# Patient Record
Sex: Male | Born: 1955 | Race: White | Hispanic: No | Marital: Married | Smoking: Former smoker
Health system: Southern US, Community
[De-identification: ages and names within clinical notes are randomized; demographics above are authoritative.]

## PROBLEM LIST (undated history)

## (undated) DIAGNOSIS — E119 Type 2 diabetes mellitus without complications: Secondary | ICD-10-CM

## (undated) DIAGNOSIS — E669 Obesity, unspecified: Secondary | ICD-10-CM

## (undated) DIAGNOSIS — I10 Essential (primary) hypertension: Secondary | ICD-10-CM

## (undated) DIAGNOSIS — H409 Unspecified glaucoma: Secondary | ICD-10-CM

## (undated) DIAGNOSIS — G4730 Sleep apnea, unspecified: Secondary | ICD-10-CM

## (undated) DIAGNOSIS — E785 Hyperlipidemia, unspecified: Secondary | ICD-10-CM

## (undated) HISTORY — DX: Obesity, unspecified: E66.9

## (undated) HISTORY — PX: WISDOM TOOTH EXTRACTION: SHX21

## (undated) HISTORY — DX: Sleep apnea, unspecified: G47.30

## (undated) HISTORY — DX: Hyperlipidemia, unspecified: E78.5

## (undated) HISTORY — DX: Unspecified glaucoma: H40.9

## (undated) HISTORY — DX: Type 2 diabetes mellitus without complications: E11.9

## (undated) HISTORY — PX: TONSILLECTOMY AND ADENOIDECTOMY: SUR1326

## (undated) HISTORY — DX: Essential (primary) hypertension: I10

---

## 2010-03-03 ENCOUNTER — Emergency Department (HOSPITAL_COMMUNITY)
Admission: EM | Admit: 2010-03-03 | Discharge: 2010-03-03 | Payer: Self-pay | Source: Home / Self Care | Admitting: Emergency Medicine

## 2010-05-13 LAB — DIFFERENTIAL
Basophils Absolute: 0 10*3/uL (ref 0.0–0.1)
Basophils Relative: 0 % (ref 0–1)
Eosinophils Absolute: 0.1 10*3/uL (ref 0.0–0.7)
Eosinophils Relative: 1 % (ref 0–5)
Lymphocytes Relative: 14 % (ref 12–46)

## 2010-05-13 LAB — URINE MICROSCOPIC-ADD ON

## 2010-05-13 LAB — URINALYSIS, ROUTINE W REFLEX MICROSCOPIC
Glucose, UA: NEGATIVE mg/dL
Leukocytes, UA: NEGATIVE
Nitrite: NEGATIVE
Protein, ur: 100 mg/dL — AB

## 2010-05-13 LAB — CBC
MCH: 30.6 pg (ref 26.0–34.0)
MCHC: 33.3 g/dL (ref 30.0–36.0)
MCV: 91.9 fL (ref 78.0–100.0)
Platelets: 233 10*3/uL (ref 150–400)
RDW: 13.8 % (ref 11.5–15.5)
WBC: 8.8 10*3/uL (ref 4.0–10.5)

## 2010-05-13 LAB — POCT I-STAT, CHEM 8
Calcium, Ion: 1.08 mmol/L — ABNORMAL LOW (ref 1.12–1.32)
Glucose, Bld: 191 mg/dL — ABNORMAL HIGH (ref 70–99)
HCT: 45 % (ref 39.0–52.0)
TCO2: 24 mmol/L (ref 0–100)

## 2010-05-13 LAB — LIPASE, BLOOD: Lipase: 27 U/L (ref 11–59)

## 2012-10-20 ENCOUNTER — Ambulatory Visit (INDEPENDENT_AMBULATORY_CARE_PROVIDER_SITE_OTHER): Payer: Managed Care, Other (non HMO) | Admitting: Family Medicine

## 2012-10-20 VITALS — BP 136/76 | HR 83 | Temp 98.9°F | Resp 18 | Ht 72.0 in | Wt 314.0 lb

## 2012-10-20 DIAGNOSIS — Z13220 Encounter for screening for lipoid disorders: Secondary | ICD-10-CM

## 2012-10-20 DIAGNOSIS — L089 Local infection of the skin and subcutaneous tissue, unspecified: Secondary | ICD-10-CM

## 2012-10-20 DIAGNOSIS — Z131 Encounter for screening for diabetes mellitus: Secondary | ICD-10-CM

## 2012-10-20 LAB — POCT GLYCOSYLATED HEMOGLOBIN (HGB A1C): Hemoglobin A1C: 12.6

## 2012-10-20 MED ORDER — METFORMIN HCL 1000 MG PO TABS: 1000.0000 mg | ORAL_TABLET | Freq: Two times a day (BID) | ORAL | Status: DC

## 2012-10-20 MED ORDER — DOXYCYCLINE HYCLATE 100 MG PO TABS: 100.0000 mg | ORAL_TABLET | Freq: Two times a day (BID) | ORAL | Status: DC

## 2012-10-20 NOTE — Progress Notes (Signed)
 [facility]:  Office Visit  Chief Complaint:  Chief Complaint  Patient presents with  . Abrasion    right shin happened Sunday today it looks infected    HPI: Jay [name] is a 57 y.o. male who complains of here for  Right shin scrap x 3 days. He slid his leg againsta rataining wall in front yard. He did not fall on it outright. He tried neosporin and hydrogen peroxide but has pain in area of abrasion when it is touched. He denies DM. Denies fevers, chills. Has tried cleaning with hydrogen peroxide and water. + warmth, redness, pain  History reviewed. No pertinent past medical history. Past Surgical History  Procedure Laterality Date  . Tonsilectomy, adenoidectomy, bilateral myringotomy and tubes     History   Social History  . Marital Status: Married    Spouse Name: N/A    Number of Children: N/A  . Years of Education: N/A   Social History Main Topics  . Smoking status: Never Smoker   . Smokeless tobacco: Current User  . Alcohol Use: 2.5 oz/week    5 drink(s) per week  . Drug Use: No  . Sexual Activity: None   Other Topics Concern  . None   Social History Narrative  . None   Family History  Problem Relation Age of Onset  . Diabetes Father    No Known Allergies Prior to Admission medications   Not on File     ROS: The patient denies fevers, chills, night sweats, unintentional weight loss, chest pain, palpitations, wheezing, dyspnea on exertion, nausea, vomiting, abdominal pain, dysuria, hematuria, melena, numbness, weakness, or tingling.   All other systems have been reviewed and were otherwise negative with the exception of those mentioned in the HPI and as above.    PHYSICAL EXAM: Filed Vitals:   10/20/12 0831  BP: 136/76  Pulse: 83  Temp: 98.9 F (37.2 C)  Resp: 18   Filed Vitals:   10/20/12 0831  Height: 6' (1.829 m)  Weight: 314 lb (142.429 kg)   Body mass index is 42.58 kg/(m^2).  General: Alert, no acute  distress HEENT:  Normocephalic, atraumatic, oropharynx patent. EOMI, PERRLA Cardiovascular:  Regular rate and rhythm, no rubs murmurs or gallops.  No Carotid bruits, radial pulse intact. No pedal edema.  Respiratory: Clear to auscultation bilaterally.  No wheezes, rales, or rhonchi.  No cyanosis, no use of accessory musculature GI: No organomegaly, abdomen is soft and non-tender, positive bowel sounds.  No masses. Skin: + erythematous, tender abrasion x 2 areas on right shin. No drainage. There is some necrotic slough, which we  washed off with gauze and NS water. + DP  Neurologic: Facial musculature symmetric. Psychiatric: Patient is appropriate throughout our interaction. Lymphatic: No cervical lymphadenopathy Musculoskeletal: Gait intact.   LABS: Results for orders placed in visit on 10/20/12  POCT GLYCOSYLATED HEMOGLOBIN (HGB A1C)      Result Value Range   Hemoglobin A1C 12.6       EKG/XRAY:   Primary read interpreted by Dr. Conley Rolls at [facility].   ASSESSMENT/PLAN: Encounter Diagnoses  Name Primary?  . Skin infection Yes  . Screening for diabetes mellitus    Rx Doxycycline Gave him leftover silvidene to use on abrasion and area of infection so that area stays appropriately moist but not wet, once daily with non adhesive pad for protection.  He denies having DM but I am suspicious he may be diabetic due to body habitus. He will be screened  for DM F/u prn or if no improvement in 72 hours or if there is expansion of redness. Currently there is redness and warmth around the rim of the abrasion which may or may not be early signs of cellulitis , no streaks of erythema going up leg yet.  Gross sideeffects, risk and benefits, and alternatives of medications d/w patient. Patient is aware that all medications have potential sideeffects and we are unable to predict every sideeffect or drug-drug interaction that may occur.  Patient had labs drawn and then left: Spoke with pt after he left  regarding elevated HbA1c-- that he has DM He will start on metformin We discussed SEs and things he can do , recommend ADA website Follow-up in 2-3 months Will get CMP and also Lipid for fasting blood work in AM, he will come in for just labs.  He will schedule annual visit and also DM f/u in 2-3 months with Drs Clelia Croft, Audria Nine or Neva Seat. He can take fasting blood sugars 1-2 times a week if he wants but I advise that HbA1C is ok for now, he states his wife is a nurse so will do it for him Declined going to diabetes education for now We can address all the other DM issues on next visit: ie foot care, optho exam, etc. .    ,  PHUONG, DO 10/20/2012 10:45 AM

## 2012-10-21 ENCOUNTER — Other Ambulatory Visit (INDEPENDENT_AMBULATORY_CARE_PROVIDER_SITE_OTHER): Payer: Managed Care, Other (non HMO)

## 2012-10-21 ENCOUNTER — Telehealth: Payer: Self-pay

## 2012-10-21 DIAGNOSIS — Z131 Encounter for screening for diabetes mellitus: Secondary | ICD-10-CM

## 2012-10-21 DIAGNOSIS — Z13220 Encounter for screening for lipoid disorders: Secondary | ICD-10-CM

## 2012-10-21 LAB — COMPREHENSIVE METABOLIC PANEL
ALT: 54 U/L — ABNORMAL HIGH (ref 0–53)
AST: 31 U/L (ref 0–37)
BUN: 13 mg/dL (ref 6–23)
Creat: 0.82 mg/dL (ref 0.50–1.35)
Total Bilirubin: 0.5 mg/dL (ref 0.3–1.2)

## 2012-10-21 LAB — COMPREHENSIVE METABOLIC PANEL WITH GFR
Albumin: 3.6 g/dL (ref 3.5–5.2)
Alkaline Phosphatase: 96 U/L (ref 39–117)
CO2: 24 meq/L (ref 19–32)
Calcium: 8.7 mg/dL (ref 8.4–10.5)
Chloride: 101 meq/L (ref 96–112)
Glucose, Bld: 286 mg/dL — ABNORMAL HIGH (ref 70–99)
Potassium: 4.6 meq/L (ref 3.5–5.3)
Sodium: 136 meq/L (ref 135–145)
Total Protein: 6 g/dL (ref 6.0–8.3)

## 2012-10-21 LAB — LIPID PANEL
Cholesterol: 259 mg/dL — ABNORMAL HIGH (ref 0–200)
HDL: 36 mg/dL — ABNORMAL LOW (ref >39)
LDL Cholesterol: 155 mg/dL — ABNORMAL HIGH (ref 0–99)
Total CHOL/HDL Ratio: 7.2 Ratio
Triglycerides: 338 mg/dL — ABNORMAL HIGH (ref <150)
VLDL: 68 mg/dL — ABNORMAL HIGH (ref 0–40)

## 2012-10-21 LAB — TSH: TSH: 2.609 u[IU]/mL (ref 0.350–4.500)

## 2012-10-21 MED ORDER — GLUCOSE BLOOD VI STRP: ORAL_STRIP | Status: DC

## 2012-10-21 MED ORDER — FREESTYLE LANCETS MISC: Status: AC

## 2012-10-21 MED ORDER — FREESTYLE SYSTEM KIT: PACK | Status: AC

## 2012-10-21 NOTE — Telephone Encounter (Signed)
Patient was Dx with Diabetics yesterday by Dr. Conley Rolls.  He would like to get the supplies to check he's blood sugars at home.  Please send to [facility]

## 2012-10-21 NOTE — Progress Notes (Signed)
Patient here for labs only. 

## 2012-11-18 ENCOUNTER — Telehealth: Payer: Self-pay | Admitting: Family Medicine

## 2012-11-18 DIAGNOSIS — E785 Hyperlipidemia, unspecified: Secondary | ICD-10-CM

## 2012-11-18 MED ORDER — ATORVASTATIN CALCIUM 40 MG PO TABS: 40.0000 mg | ORAL_TABLET | Freq: Every day | ORAL | Status: DC

## 2012-11-18 NOTE — Telephone Encounter (Signed)
Doing better, taking metformin regular, wife states his coloring is good nad he is better. Will start him on statin and also ask him to recheck Hba1c and fasting lipids and CMP in 2 months.

## 2013-01-06 ENCOUNTER — Other Ambulatory Visit: Payer: Self-pay

## 2013-02-28 ENCOUNTER — Encounter: Payer: Self-pay | Admitting: Family Medicine

## 2013-02-28 ENCOUNTER — Ambulatory Visit (INDEPENDENT_AMBULATORY_CARE_PROVIDER_SITE_OTHER): Payer: Managed Care, Other (non HMO) | Admitting: Family Medicine

## 2013-02-28 VITALS — BP 135/82 | HR 101 | Temp 97.7°F | Resp 18 | Ht 71.0 in | Wt 313.4 lb

## 2013-02-28 DIAGNOSIS — E669 Obesity, unspecified: Secondary | ICD-10-CM

## 2013-02-28 DIAGNOSIS — E785 Hyperlipidemia, unspecified: Secondary | ICD-10-CM

## 2013-02-28 DIAGNOSIS — E1165 Type 2 diabetes mellitus with hyperglycemia: Secondary | ICD-10-CM

## 2013-02-28 DIAGNOSIS — Z131 Encounter for screening for diabetes mellitus: Secondary | ICD-10-CM

## 2013-02-28 DIAGNOSIS — R05 Cough: Secondary | ICD-10-CM

## 2013-02-28 LAB — COMPREHENSIVE METABOLIC PANEL
AST: 24 U/L (ref 0–37)
Albumin: 3.9 g/dL (ref 3.5–5.2)
BUN: 15 mg/dL (ref 6–23)
Calcium: 9 mg/dL (ref 8.4–10.5)
Chloride: 98 mEq/L (ref 96–112)
Creat: 0.87 mg/dL (ref 0.50–1.35)
Glucose, Bld: 305 mg/dL — ABNORMAL HIGH (ref 70–99)
Potassium: 4.4 mEq/L (ref 3.5–5.3)

## 2013-02-28 LAB — POCT GLYCOSYLATED HEMOGLOBIN (HGB A1C): Hemoglobin A1C: 13

## 2013-02-28 LAB — LIPID PANEL
HDL: 30 mg/dL — ABNORMAL LOW (ref >39)
Total CHOL/HDL Ratio: 5.4 Ratio
Triglycerides: 230 mg/dL — ABNORMAL HIGH (ref <150)

## 2013-02-28 MED ORDER — ATORVASTATIN CALCIUM 40 MG PO TABS: 40.0000 mg | ORAL_TABLET | Freq: Every day | ORAL | Status: DC

## 2013-02-28 MED ORDER — METFORMIN HCL 1000 MG PO TABS: 1000.0000 mg | ORAL_TABLET | Freq: Two times a day (BID) | ORAL | Status: DC

## 2013-02-28 MED ORDER — GLIPIZIDE 5 MG PO TABS: 5.0000 mg | ORAL_TABLET | Freq: Two times a day (BID) | ORAL | Status: DC

## 2013-02-28 NOTE — Patient Instructions (Addendum)
To look up more info on your condition, go to the website [email or web address], then on patient resources - select [facility]. Under patient resources, select Diabetes.  Also ADA website can provide information.  You should receive a call or letter about your lab results within the next week to 10 days.   Start walking most days of week for exercise, diet changes with portion control and diabetes classes.  Continue metformin twice per day, add glipizide twice per day (watch for low blood sugar symptoms, but unlikely with your current readings- info on this below). Recheck in 6 weeks.   If cough not improving next week or two - recheck to discuss other possible causes.  Return to the clinic or go to the nearest emergency room if any of your symptoms worsen or new symptoms occur.   Diabetes and Exercise Exercising regularly is important. It is not just about losing weight. It has many health benefits, such as:  Improving your overall fitness, flexibility, and endurance.  Increasing your bone density.  Helping with weight control.  Decreasing your body fat.  Increasing your muscle strength.  Reducing stress and tension.  Improving your overall health. People with diabetes who exercise gain additional benefits because exercise:  Reduces appetite.  Improves the body's use of blood sugar (glucose).  Helps lower or control blood glucose.  Decreases blood pressure.  Helps control blood lipids (such as cholesterol and triglycerides).  Improves the body's use of the hormone insulin by:  Increasing the body's insulin sensitivity.  Reducing the body's insulin needs.  Decreases the risk for heart disease because exercising:  Lowers cholesterol and triglycerides levels.  Increases the levels of good cholesterol (such as high-density lipoproteins [HDL]) in the body.  Lowers blood glucose levels. YOUR ACTIVITY PLAN  Choose an activity that you enjoy and set realistic goals. Your health  care provider or diabetes educator can help you make an activity plan that works for you. You can break activities into 2 or 3 sessions throughout the day. Doing so is as good as one long session. Exercise ideas include:  Taking the dog for a walk.  Taking the stairs instead of the elevator.  Dancing to your favorite song.  Doing your favorite exercise with a friend. RECOMMENDATIONS FOR EXERCISING WITH TYPE 1 OR TYPE 2 DIABETES   Check your blood glucose before exercising. If blood glucose levels are greater than 240 mg/dL, check for urine ketones. Do not exercise if ketones are present.  Avoid injecting insulin into areas of the body that are going to be exercised. For example, avoid injecting insulin into:  The arms when playing tennis.  The legs when jogging.  Keep a record of:  Food intake before and after you exercise.  Expected peak times of insulin action.  Blood glucose levels before and after you exercise.  The type and amount of exercise you have done.  Review your records with your health care provider. Your health care provider will help you to develop guidelines for adjusting food intake and insulin amounts before and after exercising.  If you take insulin or oral hypoglycemic agents, watch for signs and symptoms of hypoglycemia. They include:  Dizziness.  Shaking.  Sweating.  Chills.  Confusion.  Drink plenty of water while you exercise to prevent dehydration or heat stroke. Body water is lost during exercise and must be replaced.  Talk to your health care provider before starting an exercise program to make sure it is safe for you. Remember, almost any  type of activity is better than none. Document Released: 05/10/2003 Document Revised: 10/20/2012 Document Reviewed: 07/27/2012 [facility] Patient Information 2014 [place], [place].  Hypoglycemia (Low Blood Sugar) Hypoglycemia is when the glucose (sugar) in your blood is too low. Hypoglycemia can happen for  many reasons. It can happen to people with or without diabetes. Hypoglycemia can develop quickly and can be a medical emergency.  CAUSES  Having hypoglycemia does not mean that you will develop diabetes. Different causes include:  Missed or delayed meals or not enough carbohydrates eaten.  Medication overdose. This could be by accident or deliberate. If by accident, your medication may need to be adjusted or changed.  Exercise or increased activity without adjustments in carbohydrates or medications.  A nerve disorder that affects body functions like your heart rate, blood pressure and digestion (autonomic neuropathy).  A condition where the stomach muscles do not function properly (gastroparesis). Therefore, medications may not absorb properly.  The inability to recognize the signs of hypoglycemia (hypoglycemic unawareness).  Absorption of insulin  may be altered.  Alcohol consumption.  Pregnancy/menstrual cycles/postpartum. This may be due to hormones.  Certain kinds of tumors. This is very rare. SYMPTOMS   Sweating.  Hunger.  Dizziness.  Blurred vision.  Drowsiness.  Weakness.  Headache.  Rapid heart beat.  Shakiness.  Nervousness. DIAGNOSIS  Diagnosis is made by monitoring blood glucose in one or all of the following ways:  Fingerstick blood glucose monitoring.  Laboratory results. TREATMENT  If you think your blood glucose is low:  Check your blood glucose, if possible. If it is less than 70 mg/dl, take one of the following:  3-4 glucose tablets.   cup juice (prefer clear like apple).   cup "regular" soda pop.  1 cup milk.  -1 tube of glucose gel.  5-6 hard candies.  Do not over treat because your blood glucose (sugar) will only go too high.  Wait 15 minutes and recheck your blood glucose. If it is still less than 70 mg/dl (or below your target range), repeat treatment.  Eat a snack if it is more than one hour until your next  meal. Sometimes, your blood glucose may go so low that you are unable to treat yourself. You may need someone to help you. You may even pass out or be unable to swallow. This may require you to get an injection of glucagon, which raises the blood glucose. HOME CARE INSTRUCTIONS  Check blood glucose as recommended by your caregiver.  Take medication as prescribed by your caregiver.  Follow your meal plan. Do not skip meals. Eat on time.  If you are going to drink alcohol, drink it only with meals.  Check your blood glucose before driving.  Check your blood glucose before and after exercise. If you exercise longer or different than usual, be sure to check blood glucose more frequently.  Always carry treatment with you. Glucose tablets are the easiest to carry.  Always wear medical alert jewelry or carry some form of identification that states that you have diabetes. This will alert people that you have diabetes. If you have hypoglycemia, they will have a better idea on what to do. SEEK MEDICAL CARE IF:   You are having problems keeping your blood sugar at target range.  You are having frequent episodes of hypoglycemia.  You feel you might be having side effects from your medicines.  You have symptoms of an illness that is not improving after 3-4 days.  You notice a change in  vision or a new problem with your vision. SEEK IMMEDIATE MEDICAL CARE IF:   You are a family member or friend of a person whose blood glucose goes below 70 mg/dl and is accompanied by:  Confusion.  A change in mental status.  The inability to swallow.  Passing out. Document Released: 02/17/2005 Document Revised: 05/12/2011 Document Reviewed: 06/16/2011 [facility] Patient Information 2014 [place], [place].

## 2013-02-28 NOTE — Progress Notes (Signed)
Subjective:    Patient ID: Jay [name], male    DOB: Nov 25, 1955, 57 y.o.   MRN: [id number]  HPI Jay [name] is a 57 y.o. male New pt to me, Patient was Dx wit Dm2 at 10/20/12 OV with Dr. Conley Rolls, had not been to doctor in past 5-6 years. HGB A1c 12.6. Started on metformin 1000mg  BID. Recommend ADA website, and follow-up in 2-3 months. Initially declined DM classes.   Dm2- home blood sugars checked with wife (who is a Engineer, civil (consulting)). 250-300's. Lowest 249, highest 320. Taking metformin BID - rare missed dose. May be amenable to night or weekend classes. optho appt this Friday. Has regular dental care. Has read up some on DM2. Weight 314 last ov - 313 today. No regular exercise, no diet changes.   Other concern from wife  - chest congestion at times this time of year. Still feels some upper congestion at times, but clearer over past few weeks. Rare cough now. No fever, no dyspnea, no chest pains. No known hx of allergies. Nonsmoker.   Hyperlipidemia - as below from 10/2012 labs. Started on Lipitor 40mg  qd. Fasting today. Running out recently - every other day for past weeks. No new side effects with statin.  Lab Results  Component Value Date   CHOL 259* 10/21/2012   HDL 36* 10/21/2012   LDLCALC 155* 10/21/2012   TRIG 338* 10/21/2012   CHOLHDL 7.2 10/21/2012   Review of Systems  Constitutional: Negative for fatigue and unexpected weight change.  Eyes: Negative for visual disturbance.  Cardiovascular: Negative for chest pain.  Genitourinary: Negative for difficulty urinating.  Musculoskeletal: Negative for arthralgias.  Neurological: Positive for numbness (toes tingling, but improved. ).       Objective:   Physical Exam Filed Vitals:   02/28/13 1336  BP: 135/82  Pulse: 101  Temp: 97.7 F (36.5 C)  TempSrc: Oral  Resp: 18  Height: 5\' 11"  (1.803 m)  Weight: 313 lb 6.4 oz (142.157 kg)  SpO2: 99%    Results for orders placed in visit on 02/28/13  POCT GLYCOSYLATED HEMOGLOBIN (HGB A1C)   Result Value Range   Hemoglobin A1C 13.0    GLUCOSE, POCT (MANUAL RESULT ENTRY)      Result Value Range   POC Glucose 304 (*) 70 - 99 mg/dl   EKG: sr, left axis, no acute findings, but no prior available for review.      Assessment & Plan:   Jay [name] is a 57 y.o. male DM (diabetes mellitus), type 2, uncontrolled - Plan: glipiZIDE (GLUCOTROL) 5 MG tablet, Ambulatory referral to diabetic education, EKG 12-Lead, EKG 12-Lead. Uncontrolled. Discussed likely need for basal insulin, but he would like to try to add oral medication and work on diet/exercise as recommended prior, with recheck in 6 weeks. Will also refer to DM /nut'nmgt center.   Other and unspecified hyperlipidemia - Plan: Comprehensive metabolic panel, Lipid Panel. Cont lipitor for now. May be elevated as not adherent to daily dosing recently with running out of meds. Encouraged in future to rtc or call prior to running out.   Obesity, unspecified - Plan: Ambulatory referral to diabetic education, EKG 12-Lead, EKG 12-Lead  Cough - clear, asx today. rtc if persists and consider CXR or other causes of recurrent cough (PND/AR/GERD).  Meds ordered this encounter  Medications  . glipiZIDE (GLUCOTROL) 5 MG tablet    Sig: Take 1 tablet (5 mg total) by mouth 2 (two) times daily before a meal.    Dispense:  60 tablet    Refill:  3  . metFORMIN (GLUCOPHAGE) 1000 MG tablet    Sig: Take 1 tablet (1,000 mg total) by mouth 2 (two) times daily with a meal.    Dispense:  180 tablet    Refill:  1  . atorvastatin (LIPITOR) 40 MG tablet    Sig: Take 1 tablet (40 mg total) by mouth at bedtime.    Dispense:  90 tablet    Refill:  1   Patient Instructions  To look up more info on your condition, go to the website [email or web address], then on patient resources - select [facility]. Under patient resources, select Diabetes.  Also ADA website can provide information.  You should receive a call or letter about your lab results within the next week to  10 days.   Start walking most days of week for exercise, diet changes with portion control and diabetes classes.  Continue metformin twice per day, add glipizide twice per day (watch for low blood sugar symptoms, but unlikely with your current readings- info on this below). Recheck in 6 weeks.   If cough not improving next week or two - recheck to discuss other possible causes.  Return to the clinic or go to the nearest emergency room if any of your symptoms worsen or new symptoms occur.   Diabetes and Exercise Exercising regularly is important. It is not just about losing weight. It has many health benefits, such as:  Improving your overall fitness, flexibility, and endurance.  Increasing your bone density.  Helping with weight control.  Decreasing your body fat.  Increasing your muscle strength.  Reducing stress and tension.  Improving your overall health. People with diabetes who exercise gain additional benefits because exercise:  Reduces appetite.  Improves the body's use of blood sugar (glucose).  Helps lower or control blood glucose.  Decreases blood pressure.  Helps control blood lipids (such as cholesterol and triglycerides).  Improves the body's use of the hormone insulin by:  Increasing the body's insulin sensitivity.  Reducing the body's insulin needs.  Decreases the risk for heart disease because exercising:  Lowers cholesterol and triglycerides levels.  Increases the levels of good cholesterol (such as high-density lipoproteins [HDL]) in the body.  Lowers blood glucose levels. YOUR ACTIVITY PLAN  Choose an activity that you enjoy and set realistic goals. Your health care provider or diabetes educator can help you make an activity plan that works for you. You can break activities into 2 or 3 sessions throughout the day. Doing so is as good as one long session. Exercise ideas include:  Taking the dog for a walk.  Taking the stairs instead of the  elevator.  Dancing to your favorite song.  Doing your favorite exercise with a friend. RECOMMENDATIONS FOR EXERCISING WITH TYPE 1 OR TYPE 2 DIABETES   Check your blood glucose before exercising. If blood glucose levels are greater than 240 mg/dL, check for urine ketones. Do not exercise if ketones are present.  Avoid injecting insulin into areas of the body that are going to be exercised. For example, avoid injecting insulin into:  The arms when playing tennis.  The legs when jogging.  Keep a record of:  Food intake before and after you exercise.  Expected peak times of insulin action.  Blood glucose levels before and after you exercise.  The type and amount of exercise you have done.  Review your records with your health care provider. Your health care provider will help you to develop  guidelines for adjusting food intake and insulin amounts before and after exercising.  If you take insulin or oral hypoglycemic agents, watch for signs and symptoms of hypoglycemia. They include:  Dizziness.  Shaking.  Sweating.  Chills.  Confusion.  Drink plenty of water while you exercise to prevent dehydration or heat stroke. Body water is lost during exercise and must be replaced.  Talk to your health care provider before starting an exercise program to make sure it is safe for you. Remember, almost any type of activity is better than none. Document Released: 05/10/2003 Document Revised: 10/20/2012 Document Reviewed: 07/27/2012 [facility] Patient Information 2014 [place], [place].  Hypoglycemia (Low Blood Sugar) Hypoglycemia is when the glucose (sugar) in your blood is too low. Hypoglycemia can happen for many reasons. It can happen to people with or without diabetes. Hypoglycemia can develop quickly and can be a medical emergency.  CAUSES  Having hypoglycemia does not mean that you will develop diabetes. Different causes include:  Missed or delayed meals or not enough carbohydrates  eaten.  Medication overdose. This could be by accident or deliberate. If by accident, your medication may need to be adjusted or changed.  Exercise or increased activity without adjustments in carbohydrates or medications.  A nerve disorder that affects body functions like your heart rate, blood pressure and digestion (autonomic neuropathy).  A condition where the stomach muscles do not function properly (gastroparesis). Therefore, medications may not absorb properly.  The inability to recognize the signs of hypoglycemia (hypoglycemic unawareness).  Absorption of insulin  may be altered.  Alcohol consumption.  Pregnancy/menstrual cycles/postpartum. This may be due to hormones.  Certain kinds of tumors. This is very rare. SYMPTOMS   Sweating.  Hunger.  Dizziness.  Blurred vision.  Drowsiness.  Weakness.  Headache.  Rapid heart beat.  Shakiness.  Nervousness. DIAGNOSIS  Diagnosis is made by monitoring blood glucose in one or all of the following ways:  Fingerstick blood glucose monitoring.  Laboratory results. TREATMENT  If you think your blood glucose is low:  Check your blood glucose, if possible. If it is less than 70 mg/dl, take one of the following:  3-4 glucose tablets.   cup juice (prefer clear like apple).   cup "regular" soda pop.  1 cup milk.  -1 tube of glucose gel.  5-6 hard candies.  Do not over treat because your blood glucose (sugar) will only go too high.  Wait 15 minutes and recheck your blood glucose. If it is still less than 70 mg/dl (or below your target range), repeat treatment.  Eat a snack if it is more than one hour until your next meal. Sometimes, your blood glucose may go so low that you are unable to treat yourself. You may need someone to help you. You may even pass out or be unable to swallow. This may require you to get an injection of glucagon, which raises the blood glucose. HOME CARE INSTRUCTIONS  Check blood  glucose as recommended by your caregiver.  Take medication as prescribed by your caregiver.  Follow your meal plan. Do not skip meals. Eat on time.  If you are going to drink alcohol, drink it only with meals.  Check your blood glucose before driving.  Check your blood glucose before and after exercise. If you exercise longer or different than usual, be sure to check blood glucose more frequently.  Always carry treatment with you. Glucose tablets are the easiest to carry.  Always wear medical alert jewelry or carry some form  of identification that states that you have diabetes. This will alert people that you have diabetes. If you have hypoglycemia, they will have a better idea on what to do. SEEK MEDICAL CARE IF:   You are having problems keeping your blood sugar at target range.  You are having frequent episodes of hypoglycemia.  You feel you might be having side effects from your medicines.  You have symptoms of an illness that is not improving after 3-4 days.  You notice a change in vision or a new problem with your vision. SEEK IMMEDIATE MEDICAL CARE IF:   You are a family member or friend of a person whose blood glucose goes below 70 mg/dl and is accompanied by:  Confusion.  A change in mental status.  The inability to swallow.  Passing out. Document Released: 02/17/2005 Document Revised: 05/12/2011 Document Reviewed: 06/16/2011 [facility] Patient Information 2014 [place], [place].

## 2013-04-11 ENCOUNTER — Ambulatory Visit (INDEPENDENT_AMBULATORY_CARE_PROVIDER_SITE_OTHER): Payer: Managed Care, Other (non HMO) | Admitting: Family Medicine

## 2013-04-11 ENCOUNTER — Encounter: Payer: Self-pay | Admitting: Family Medicine

## 2013-04-11 VITALS — BP 128/80 | HR 86 | Temp 98.6°F | Resp 16 | Ht 70.5 in | Wt 314.6 lb

## 2013-04-11 DIAGNOSIS — E119 Type 2 diabetes mellitus without complications: Secondary | ICD-10-CM

## 2013-04-11 DIAGNOSIS — IMO0001 Reserved for inherently not codable concepts without codable children: Secondary | ICD-10-CM

## 2013-04-11 DIAGNOSIS — R059 Cough, unspecified: Secondary | ICD-10-CM

## 2013-04-11 DIAGNOSIS — E1165 Type 2 diabetes mellitus with hyperglycemia: Secondary | ICD-10-CM

## 2013-04-11 DIAGNOSIS — R05 Cough: Secondary | ICD-10-CM

## 2013-04-11 LAB — GLUCOSE, POCT (MANUAL RESULT ENTRY): POC Glucose: 116 mg/dl — AB (ref 70–99)

## 2013-04-11 NOTE — Progress Notes (Signed)
Subjective:    Patient ID: Jay [name], male    DOB: [birth year], 58 y.o.   MRN: [id number]  HPI Jay [name] is a 58 y.o. male   Here for follow up DM2.  New pt to me in December. Patient was Dx wit Dm2 at 10/20/12 OV with Dr. Marin Comment, had not been to doctor in past 5-6 years. HGB A1c 12.6. Started on metformin 1059m BID. Recommend ADA website, and follow-up in 2-3 months.  At last ov - home cbg's 250-300's. Lowest 249, highest 320. Taking metformin BID - rare missed dose.   Discussed likely need for basal insulin, but wanted to try to add oral medication and work on diet/exercise as recommended prior. Added glipizide 572mBID. also referred to DM [facility].   Home readings -124 lowest, 136 this am. Usually in the low 100's, no symptomatic lows. Meter: 30 dayavg 183, 14 day avg 142, miss doses of meds about once per week. Weight 314 initial ov - 313  Last ov. 314.  No new diet or exercise changes - weight had increased with recent trip,  1st class tomorrow with [facility]. No new side effects with meds.   Lab Results  Component Value Date   HGBA1C 13.0 02/28/2013   hyperlipedemia - on lipitor 4037md.  Lab Results  Component Value Date   CHOL 163 02/28/2013   HDL 30* 02/28/2013   LDLCALC 87 02/28/2013   TRIG 230* 02/28/2013   CHOLHDL 5.4 02/28/2013    Cough - occasional chest congestion, flair, but improving now. Mucus at times, min heartburn. Prior had heartburn. No known allergies, but had some flair of cough when traveling to [place].[facility]  There are no active problems to display for this patient.  No past medical history on file. Past Surgical History  Procedure Laterality Date  . Tonsilectomy, adenoidectomy, bilateral myringotomy and tubes     No Known Allergies Prior to Admission medications   Medication Sig Start Date End Date Taking? Authorizing Provider  aspirin 81 MG tablet Take 81 mg by mouth daily.   Yes Historical Provider, MD  atorvastatin (LIPITOR) 40 MG tablet  Take 1 tablet (40 mg total) by mouth at bedtime. 02/28/13  Yes JefWendie AgresteD  glipiZIDE (GLUCOTROL) 5 MG tablet Take 1 tablet (5 mg total) by mouth 2 (two) times daily before a meal. 02/28/13  Yes JefWendie AgresteD  glucose blood test strip Glucose strips that fit his new glucometer.Use as instructed 10/21/12  Yes Thao P Le, DO  glucose monitoring kit (FREESTYLE) monitoring kit Patient may pick any glucometer he prefers. Use as directed. 10/21/12  Yes Thao P Le, DO  Lancets (FREESTYLE) lancets Patient preference. Use as instructed 10/21/12  Yes Thao P Le, DO  metFORMIN (GLUCOPHAGE) 1000 MG tablet Take 1 tablet (1,000 mg total) by mouth 2 (two) times daily with a meal. 02/28/13  Yes JefWendie AgresteD  OVER THE COUNTER MEDICATION Fish Oil 1200 mg taking everyday.   Yes Historical Provider, MD  PRESCRIPTION MEDICATION Travatan 0.004% taking one drop each eye at night   Yes Historical Provider, MD  doxycycline (VIBRA-TABS) 100 MG tablet Take 1 tablet (100 mg total) by mouth 2 (two) times daily. 10/20/12   Thao P Le, DO   History   Social History  . Marital Status: Married    Spouse Name: N/A    Number of Children: N/A  . Years of Education: N/A   Occupational History  .  Not on file.   Social History Main Topics  . Smoking status: Never Smoker   . Smokeless tobacco: Current User  . Alcohol Use: 2.5 oz/week    5 drink(s) per week  . Drug Use: No  . Sexual Activity: Not on file   Other Topics Concern  . Not on file   Social History Narrative  . No narrative on file       Review of Systems  Constitutional: Negative for fatigue and unexpected weight change.  Eyes: Negative for visual disturbance.  Respiratory: Positive for cough (occasional - improving now. ). Negative for chest tightness and shortness of breath.   Cardiovascular: Negative for chest pain and palpitations.  Gastrointestinal: Negative for abdominal pain and blood in stool.  Neurological: Negative for  dizziness, light-headedness and headaches.       Objective:   Physical Exam  Vitals reviewed. Constitutional: He is oriented to person, place, and time. He appears well-developed and well-nourished.  HENT:  Head: Normocephalic and atraumatic.  Eyes: EOM are normal. Pupils are equal, round, and reactive to light.  Neck: No JVD present. Carotid bruit is not present.  Cardiovascular: Normal rate, regular rhythm and normal heart sounds.   No murmur heard. Pulmonary/Chest: Effort normal and breath sounds normal. No respiratory distress. He has no wheezes. He has no rales.  Musculoskeletal: He exhibits edema (trace pedal bilat. ).  Neurological: He is alert and oriented to person, place, and time.  Skin: Skin is warm and dry.  Psychiatric: He has a normal mood and affect.   Filed Vitals:   04/11/13 1332  BP: 128/80  Pulse: 86  Temp: 98.6 F (37 C)  TempSrc: Oral  Resp: 16  Height: 5' 10.5" (1.791 m)  Weight: 314 lb 9.6 oz (142.702 kg)  SpO2: 94%   Results for orders placed in visit on 04/11/13  GLUCOSE, POCT (MANUAL RESULT ENTRY)      Result Value Range   POC Glucose 116 (*) 70 - 99 mg/dl       Assessment & Plan:   Jay [name] is a 58 y.o. male Type II or unspecified type diabetes mellitus without mention of complication, not stated as uncontrolled - Plan: HM Diabetes Foot Exam, POCT glucose (manual entry), Type II or unspecified type diabetes mellitus without mention of complication, uncontrolled.  - improving home readings with glipizide and metformin. Discussed need for diet/exercise changes, but will also be discussed at DM/[facility]ut[facility] meetings. Recheck in 1st of April for Hgb A1c.    Cough - intermittent, persistent, with worsening in vacation in [place] - viral illness vs. underlying allergies vs LPR with hx of heartburn, but denies these sx's recently. Trial of claritin and zantac otc, recheck in next few weeks if not improving.    Meds ordered this encounter    Medications  . OVER THE COUNTER MEDICATION    Sig: Fish Oil 1200 mg taking everyday.  Marland Kitchen aspirin 81 MG tablet    Sig: Take 81 mg by mouth daily.  Marland Kitchen PRESCRIPTION MEDICATION    Sig: Travatan 0.004% taking one drop each eye at night   Patient Instructions  Start claritin and zantac over the counter for 2 common causes oif cough - if not improving in next 2 weeks - return to discuss further.  Return to the clinic or go to the nearest emergency room if any of your symptoms worsen or new symptoms occur.  No change in diabetes meds now - recheck first of April for  repeat testing. keep record of blood sugars.   Cough, Adult  A cough is a reflex that helps clear your throat and airways. It can help heal the body or may be a reaction to an irritated airway. A cough may only last 2 or 3 weeks (acute) or may last more than 8 weeks (chronic).  CAUSES Acute cough:  Viral or bacterial infections. Chronic cough:  Infections.  Allergies.  Asthma.  Post-nasal drip.  Smoking.  Heartburn or acid reflux.  Some medicines.  Chronic lung problems (COPD).  Cancer. SYMPTOMS   Cough.  Fever.  Chest pain.  Increased breathing rate.  High-pitched whistling sound when breathing (wheezing).  Colored mucus that you cough up (sputum). TREATMENT   A bacterial cough may be treated with antibiotic medicine.  A viral cough must run its course and will not respond to antibiotics.  Your caregiver may recommend other treatments if you have a chronic cough. HOME CARE INSTRUCTIONS   Only take over-the-counter or prescription medicines for pain, discomfort, or fever as directed by your caregiver. Use cough suppressants only as directed by your caregiver.  Use a cold steam vaporizer or humidifier in your bedroom or home to help loosen secretions.  Sleep in a semi-upright position if your cough is worse at night.  Rest as needed.  Stop smoking if you smoke. SEEK IMMEDIATE MEDICAL CARE IF:    You have pus in your sputum.  Your cough starts to worsen.  You cannot control your cough with suppressants and are losing sleep.  You begin coughing up blood.  You have difficulty breathing.  You develop pain which is getting worse or is uncontrolled with medicine.  You have a fever. MAKE SURE YOU:   Understand these instructions.  Will watch your condition.  Will get help right away if you are not doing well or get worse. Document Released: 08/16/2010 Document Revised: 05/12/2011 Document Reviewed: 08/16/2010 [facility] Patient Information 2014 [place].

## 2013-04-11 NOTE — Patient Instructions (Signed)
Start claritin and zantac over the counter for 2 common causes oif cough - if not improving in next 2 weeks - return to discuss further.  Return to the clinic or go to the nearest emergency room if any of your symptoms worsen or new symptoms occur.  No change in diabetes meds now - recheck first of April for repeat testing. keep record of blood sugars.   Cough, Adult  A cough is a reflex that helps clear your throat and airways. It can help heal the body or may be a reaction to an irritated airway. A cough may only last 2 or 3 weeks (acute) or may last more than 8 weeks (chronic).  CAUSES Acute cough:  Viral or bacterial infections. Chronic cough:  Infections.  Allergies.  Asthma.  Post-nasal drip.  Smoking.  Heartburn or acid reflux.  Some medicines.  Chronic lung problems (COPD).  Cancer. SYMPTOMS   Cough.  Fever.  Chest pain.  Increased breathing rate.  High-pitched whistling sound when breathing (wheezing).  Colored mucus that you cough up (sputum). TREATMENT   A bacterial cough may be treated with antibiotic medicine.  A viral cough must run its course and will not respond to antibiotics.  Your caregiver may recommend other treatments if you have a chronic cough. HOME CARE INSTRUCTIONS   Only take over-the-counter or prescription medicines for pain, discomfort, or fever as directed by your caregiver. Use cough suppressants only as directed by your caregiver.  Use a cold steam vaporizer or humidifier in your bedroom or home to help loosen secretions.  Sleep in a semi-upright position if your cough is worse at night.  Rest as needed.  Stop smoking if you smoke. SEEK IMMEDIATE MEDICAL CARE IF:   You have pus in your sputum.  Your cough starts to worsen.  You cannot control your cough with suppressants and are losing sleep.  You begin coughing up blood.  You have difficulty breathing.  You develop pain which is getting worse or is uncontrolled  with medicine.  You have a fever. MAKE SURE YOU:   Understand these instructions.  Will watch your condition.  Will get help right away if you are not doing well or get worse. Document Released: 08/16/2010 Document Revised: 05/12/2011 Document Reviewed: 08/16/2010 [facility] Patient Information 2014 [place].

## 2013-04-12 ENCOUNTER — Encounter: Payer: Self-pay | Admitting: Family Medicine

## 2013-04-12 ENCOUNTER — Encounter: Payer: Managed Care, Other (non HMO) | Attending: Family Medicine

## 2013-04-12 VITALS — Ht 71.0 in | Wt 319.2 lb

## 2013-04-12 DIAGNOSIS — E119 Type 2 diabetes mellitus without complications: Secondary | ICD-10-CM | POA: Insufficient documentation

## 2013-04-14 NOTE — Progress Notes (Signed)
Patient was seen on 04/12/13 for the first of a series of three diabetes self[facility]management courses [facility]  Current HbA1c: 13.0%  The following learning objectives were met by the patient during this class:  Describe diabetes  State some common risk factors for diabetes  Defines the role of glucose and insulin  Identifies type of diabetes and pathophysiology  Describe the relationship between diabetes and cardiovascular risk  State the members of the Healthcare Team  States the rationale for glucose monitoring  State when to test glucose  State their individual Target Range  State the importance of logging glucose readings  Describe how to interpret glucose readings  Identifies A1C target  Explain the correlation between A1c and eAG values  State symptoms and treatment of high blood glucose  State symptoms and treatment of low blood glucose  Explain proper technique for glucose testing  Identifies proper sharps disposal  Handouts given during class include:  Living Well with Diabetes book  Carb Counting and Meal Planning book  Meal Plan Card  Carbohydrate guide  Meal planning worksheet  Low Sodium Flavoring Tips  The diabetes portion plate  D3H to eAG Conversion Chart  Diabetes Medications  Diabetes Recommended Care Schedule  Support Group  Diabetes Success Plan  Core Class Satisfaction Survey  Follow-Up Plan:  Attend core 2

## 2013-04-19 ENCOUNTER — Ambulatory Visit: Payer: Managed Care, Other (non HMO)

## 2013-04-21 DIAGNOSIS — E119 Type 2 diabetes mellitus without complications: Secondary | ICD-10-CM

## 2013-04-22 NOTE — Progress Notes (Signed)

## 2013-04-26 DIAGNOSIS — E119 Type 2 diabetes mellitus without complications: Secondary | ICD-10-CM

## 2013-04-26 NOTE — Progress Notes (Signed)
Patient was seen on 04/26/13 for the third of a series of three diabetes self-management courses at [facility]. The following learning objectives were met by the patient during this class:    State the amount of activity recommended for healthy living   Describe activities suitable for individual needs   Identify ways to regularly incorporate activity into daily life   Identify barriers to activity and ways to over come these barriers  Identify diabetes medications being personally used and their primary action for lowering glucose and possible side effects   Describe role of stress on blood glucose and develop strategies to address psychosocial issues   Identify diabetes complications and ways to prevent them  Explain how to manage diabetes during illness   Evaluate success in meeting personal goal   Establish 2-3 goals that they will plan to diligently work on until they return for the  23-monthfollow-up visit  Goals:  Follow Diabetes Meal Plan as instructed  Aim for 15-30 mins of physical activity daily as tolerated  Bring food record and glucose log to your follow up visit  Your patient has established the following 4 month goals in their individualized success plan: I will count my carb choices at most meals and snacks I will take my diabetes medications as scheduled  Your patient has identified these potential barriers to change:  Work too much, tired when I get home  Your patient has identified their diabetes self-care support plan as  [facility]  wife

## 2013-06-13 ENCOUNTER — Ambulatory Visit (INDEPENDENT_AMBULATORY_CARE_PROVIDER_SITE_OTHER): Payer: Managed Care, Other (non HMO) | Admitting: Family Medicine

## 2013-06-13 ENCOUNTER — Encounter: Payer: Self-pay | Admitting: Family Medicine

## 2013-06-13 VITALS — BP 130/74 | HR 89 | Temp 97.4°F | Resp 18 | Ht 71.0 in | Wt 320.4 lb

## 2013-06-13 DIAGNOSIS — IMO0002 Reserved for concepts with insufficient information to code with codable children: Secondary | ICD-10-CM

## 2013-06-13 DIAGNOSIS — E669 Obesity, unspecified: Secondary | ICD-10-CM

## 2013-06-13 DIAGNOSIS — Z131 Encounter for screening for diabetes mellitus: Secondary | ICD-10-CM

## 2013-06-13 DIAGNOSIS — E785 Hyperlipidemia, unspecified: Secondary | ICD-10-CM

## 2013-06-13 DIAGNOSIS — E1165 Type 2 diabetes mellitus with hyperglycemia: Secondary | ICD-10-CM

## 2013-06-13 DIAGNOSIS — IMO0001 Reserved for inherently not codable concepts without codable children: Secondary | ICD-10-CM

## 2013-06-13 DIAGNOSIS — E119 Type 2 diabetes mellitus without complications: Secondary | ICD-10-CM

## 2013-06-13 LAB — POCT GLYCOSYLATED HEMOGLOBIN (HGB A1C): HEMOGLOBIN A1C: 6.9

## 2013-06-13 LAB — GLUCOSE, POCT (MANUAL RESULT ENTRY): POC GLUCOSE: 128 mg/dL — AB (ref 70–99)

## 2013-06-13 MED ORDER — GLIPIZIDE 5 MG PO TABS: 5.0000 mg | ORAL_TABLET | Freq: Two times a day (BID) | ORAL | Status: DC

## 2013-06-13 MED ORDER — METFORMIN HCL 1000 MG PO TABS: 1000.0000 mg | ORAL_TABLET | Freq: Two times a day (BID) | ORAL | Status: DC

## 2013-06-13 MED ORDER — ATORVASTATIN CALCIUM 40 MG PO TABS
40.0000 mg | ORAL_TABLET | Freq: Every day | ORAL | Status: DC
Start: 2013-06-13 — End: 2014-04-12

## 2013-06-13 NOTE — Progress Notes (Addendum)
Subjective:    Patient ID: Jay [name], male    DOB: November 21, 1955, 58 y.o.   MRN: [id number]  This chart was scribed for Jay Agreste, MD by Maree Erie, ED Scribe.  Chief Complaint  Patient presents with  . Follow-up    DIABETES AND MEDICATION REFILLS    PCP: Jay Agreste, MD   HPI  Jay [name] is a 58 y.o. male who presents to office for follow up and medication refills on Metformin, Glipizide and Lipitor.   Diabetes: Initially diagnosed in 10/2012 with A1C of 12.6. Initially on mefromin 1000 mg BID. Last office visit 04/11/13. Primary A1C of 13.0 at December office visit. Blood sugar in office was 116 at Feb visit. Improved home readings at that point. Has had three diabetes nutrition visits. He states his sugars have been between 100-120. He reports one episode of subjective hypoglycemia, feeling "shakey," but was not able to check his blood sugar. He denies issues since then or measured incidences of hypoglycemia. He has not been exercising regularly. He is walking the dog but denies that the exercise from that is significant. He states the nutrition classes were very good but expensive, at 1300 charged to insurance for six training hours. He is up six pounds from the last office visit. He states that he was doing well until he started the diabetes medication, which increased his appetite. He reports baseline skin darkening and swelling in his left lower leg. He denies any pain with walking. However, he has noticed numbness to the tip of his toes for awhile.    Hyperlipidemia: Initial cholesterol 259, LDL 155, HDL 36 seven months ago. Started on Lipitor, 40 mg QHS. Lipid panel improved three months ago with LDL 87, but HDL still low at 30. Liver tests within normal limits. He denies any side effects with the medication. He has been taking Fish Oil to prevent any myalgias.    Seasonal Allergies: He states that Claritin alleviated past chest congestion.    There are no  active problems to display for this patient.  Past Medical History  Diagnosis Date  . Diabetes mellitus without complication   . Hyperlipidemia   . Obesity   . Sleep apnea    Past Surgical History  Procedure Laterality Date  . Tonsilectomy, adenoidectomy, bilateral myringotomy and tubes     No Known Allergies Prior to Admission medications   Medication Sig Start Date End Date Taking? Authorizing Provider  aspirin 81 MG tablet Take 81 mg by mouth daily.    Historical Provider, MD  atorvastatin (LIPITOR) 40 MG tablet Take 1 tablet (40 mg total) by mouth at bedtime. 02/28/13   Jay Agreste, MD  doxycycline (VIBRA-TABS) 100 MG tablet Take 1 tablet (100 mg total) by mouth 2 (two) times daily. 10/20/12   Jay P Le, DO  glipiZIDE (GLUCOTROL) 5 MG tablet Take 1 tablet (5 mg total) by mouth 2 (two) times daily before a meal. 02/28/13   Jay Agreste, MD  glucose blood test strip Glucose strips that fit his new glucometer.Use as instructed 10/21/12   Jay P Le, DO  glucose monitoring kit (FREESTYLE) monitoring kit Patient may pick any glucometer he prefers. Use as directed. 10/21/12   Jay P Le, DO  Lancets (FREESTYLE) lancets Patient preference. Use as instructed 10/21/12   Jay P Le, DO  metFORMIN (GLUCOPHAGE) 1000 MG tablet Take 1 tablet (1,000 mg total) by mouth 2 (two) times daily with a meal. 02/28/13   Jay Patrick  Carlota Raspberry, MD  OVER THE COUNTER MEDICATION Fish Oil 1200 mg taking everyday.    Historical Provider, MD  PRESCRIPTION MEDICATION Travatan 0.004% taking one drop each eye at night    Historical Provider, MD   History   Social History  . Marital Status: Married    Spouse Name: N/A    Number of Children: N/A  . Years of Education: N/A   Occupational History  . Not on file.   Social History Main Topics  . Smoking status: Never Smoker   . Smokeless tobacco: Current User  . Alcohol Use: 2.5 oz/week    5 drink(s) per week  . Drug Use: No  . Sexual Activity: Not on file    Other Topics Concern  . Not on file   Social History Narrative  . No narrative on file     Review of Systems  Constitutional: Negative for fever and chills.  HENT: Negative for rhinorrhea.   Eyes: Negative for visual disturbance.  Respiratory: Negative for cough and shortness of breath.   Cardiovascular: Negative for chest pain and leg swelling.  Gastrointestinal: Negative for nausea, vomiting, abdominal pain and diarrhea.  Genitourinary: Negative for dysuria.  Musculoskeletal: Negative for back pain and myalgias.  Skin: Negative for rash.  Neurological: Positive for numbness (toes). Negative for headaches.  Hematological: Does not bruise/bleed easily.  Psychiatric/Behavioral: Negative for confusion.       Objective:   Physical Exam  Nursing note and vitals reviewed. Constitutional: He is oriented to person, place, and time. He appears well-developed and well-nourished.  HENT:  Head: Normocephalic and atraumatic.  Eyes: Pupils are equal, round, and reactive to light.  Cardiovascular: Normal rate, regular rhythm, normal heart sounds and intact distal pulses.   Pulmonary/Chest: Effort normal and breath sounds normal.  Abdominal: Soft. There is no tenderness.  Neurological: He is alert and oriented to person, place, and time.  Skin: Skin is warm, dry and intact. No rash noted.  Darkening of pretibial and medial lower legs. Slight hyperpigmentation of right, not as much as left of right lower leg.  Psychiatric: He has a normal mood and affect. His behavior is normal.     Body mass index is 44.71 kg/(m^2). Filed Vitals:   06/13/13 1449  BP: 130/74  Pulse: 89  Temp: 97.4 F (36.3 C)  TempSrc: Oral  Resp: 18  Height: '5\' 11"'  (1.803 m)  Weight: 320 lb 6.4 oz (145.332 kg)  SpO2: 94%   Results for orders placed in visit on 06/13/13  GLUCOSE, POCT (MANUAL RESULT ENTRY)      Result Value Ref Range   POC Glucose 128 (*) 70 - 99 mg/dl  POCT GLYCOSYLATED HEMOGLOBIN (HGB  A1C)      Result Value Ref Range   Hemoglobin A1C 6.9         Assessment & Plan:   Jay [name] is a 58 y.o. male Hyperlipidemia - Plan: atorvastatin (LIPITOR) 40 MG tablet, Lipid panel rechecked. Suspect no change in dose as on high intensity therapy given DM and risk factors and tolerating.   DM2 (diabetes mellitus, type 2) - Plan: POCT glucose (manual entry), POCT glycosylated hemoglobin (Hb A1C), COMPLETE METABOLIC PANEL WITH GFR - much improved control.  No changes in regimen, but stressed importance of exercise and weight loss with this illness, especially at the level of overweight/obesity with BMI 44.  Discussed gastric bypass as option with RF's, but agreed with him in trying to really approach diet and weight loss.  His  job is stressful right now, and apparently to improve in June, but reinforced ways to exercise with minimal time investment, such as spreading exercise out through the day, parking further, taking stairs, etc.  Skin changes/stasis changes likely on [name]'s with suspected some component of diabetic neuropathy vs underlying PVD. Can discuss further at recheck in 3 months, as no acute sx's at this time.   Plan on urine microalbumin next ov - unable to provide sample today.     Meds ordered this encounter  Medications  . OVER THE COUNTER MEDICATION    Sig: at bedtime.  Marland Kitchen atorvastatin (LIPITOR) 40 MG tablet    Sig: Take 1 tablet (40 mg total) by mouth at bedtime.    Dispense:  90 tablet    Refill:  1  . metFORMIN (GLUCOPHAGE) 1000 MG tablet    Sig: Take 1 tablet (1,000 mg total) by mouth 2 (two) times daily with a meal.    Dispense:  180 tablet    Refill:  1  . glipiZIDE (GLUCOTROL) 5 MG tablet    Sig: Take 1 tablet (5 mg total) by mouth 2 (two) times daily before a meal.    Dispense:  60 tablet    Refill:  3   Patient Instructions  Recommend a urine test for protein at next visit.  You should receive a call or letter about your lab results within the next week  to 10 days.   Diabetes control better. No changes in meds, but try to incorporate exercise any way you can in normal daily activities with goal of 150 minutes moderate exercise per week.   Recheck in 3 months.         I personally performed the services described in this documentation, which was scribed in my presence. The recorded information has been reviewed and considered, and addended by me as needed.      I personally performed the services described in this documentation, which was scribed in my presence. The recorded information has been reviewed and considered, and addended by me as needed.

## 2013-06-13 NOTE — Patient Instructions (Addendum)
Recommend a urine test for protein at next visit.  You should receive a call or letter about your lab results within the next week to 10 days.   Diabetes control better. No changes in meds, but try to incorporate exercise any way you can in normal daily activities with goal of 150 minutes moderate exercise per week.   Recheck in 3 months.

## 2013-06-14 LAB — COMPLETE METABOLIC PANEL WITH GFR
ALK PHOS: 95 U/L (ref 39–117)
ALT: 36 U/L (ref 0–53)
AST: 22 U/L (ref 0–37)
Albumin: 3.7 g/dL (ref 3.5–5.2)
BILIRUBIN TOTAL: 0.5 mg/dL (ref 0.2–1.2)
BUN: 15 mg/dL (ref 6–23)
CO2: 28 mEq/L (ref 19–32)
CREATININE: 0.89 mg/dL (ref 0.50–1.35)
Calcium: 9 mg/dL (ref 8.4–10.5)
Chloride: 104 mEq/L (ref 96–112)
GFR, Est African American: >89 mL/min
GFR, Est Non African American: >89 mL/min
Glucose, Bld: 124 mg/dL — ABNORMAL HIGH (ref 70–99)
Potassium: 4.4 mEq/L (ref 3.5–5.3)
Sodium: 141 mEq/L (ref 135–145)
Total Protein: 6.1 g/dL (ref 6.0–8.3)

## 2013-06-14 LAB — LIPID PANEL
Cholesterol: 128 mg/dL (ref 0–200)
HDL: 32 mg/dL — AB (ref >39)
LDL CALC: 76 mg/dL (ref 0–99)
TRIGLYCERIDES: 101 mg/dL (ref <150)
Total CHOL/HDL Ratio: 4 Ratio
VLDL: 20 mg/dL (ref 0–40)

## 2013-08-01 ENCOUNTER — Ambulatory Visit: Payer: Managed Care, Other (non HMO)

## 2013-09-07 ENCOUNTER — Other Ambulatory Visit: Payer: Self-pay | Admitting: Family Medicine

## 2013-09-27 ENCOUNTER — Other Ambulatory Visit: Payer: Self-pay | Admitting: Family Medicine

## 2013-10-03 ENCOUNTER — Encounter: Payer: Managed Care, Other (non HMO) | Attending: Family Medicine

## 2013-10-03 DIAGNOSIS — Z713 Dietary counseling and surveillance: Secondary | ICD-10-CM | POA: Insufficient documentation

## 2013-10-03 DIAGNOSIS — E119 Type 2 diabetes mellitus without complications: Secondary | ICD-10-CM | POA: Diagnosis present

## 2013-10-04 NOTE — Patient Instructions (Signed)
Goals:  Follow Diabetes Meal Plan as instructed  Eat 3 meals and 2 snacks, every 3-5 hrs  Limit carbohydrate intake to 45 grams carbohydrate/meal Limit carbohydrate intake to 15 grams carbohydrate/snack Add lean protein foods to meals/snacks  Monitor glucose levels as instructed by your doctor  Aim for goal of 15-30 mins of physical activity daily as tolerated  Bring food record and glucose log to your next nutrition visit 

## 2013-10-04 NOTE — Progress Notes (Signed)
Patient was seen on 10/03/2013 for a review of the series of three diabetes self-management courses at the [facility]. The following learning objectives were met by the patient during this class:    Reviewed blood glucose monitoring and interpretation including the recommended target ranges and Hgb A1c.    Reviewed on carb counting, importance of regularly scheduled meals/snacks, and meal planning.    Reviewed the effects of physical activity on glucose levels and long-term glucose control.  Recommended goal of 150 minutes of physical activity/week.   Reviewed patient medications and discussed role of medication on blood glucose and possible side effects.   Discussed strategies to manage stress, psychosocial issues, and other obstacles to diabetes management.   Encouraged moderate weight reduction to improve glucose levels.     Reviewed short-term complications: hyper- and hypo-glycemia.  Discussed causes, symptoms, and treatment options.   Reviewed prevention, detection, and treatment of long-term complications.  Discussed the role of prolonged elevated glucose levels on body systems.  Goals:  Follow Diabetes Meal Plan as instructed  Eat 3 meals and 2 snacks, every 3-5 hrs  Limit carbohydrate intake to 45 grams carbohydrate/meal Limit carbohydrate intake to 15 grams carbohydrate/snack Add lean protein foods to meals/snacks  Monitor glucose levels as instructed by your doctor  Aim for goal of 15-30 mins of physical activity daily as tolerated  Bring food record and glucose log to your next nutrition visit   

## 2013-11-28 ENCOUNTER — Other Ambulatory Visit: Payer: Self-pay | Admitting: Family Medicine

## 2013-12-05 ENCOUNTER — Other Ambulatory Visit: Payer: Self-pay | Admitting: Family Medicine

## 2013-12-08 ENCOUNTER — Other Ambulatory Visit: Payer: Self-pay | Admitting: Family Medicine

## 2014-02-04 ENCOUNTER — Other Ambulatory Visit: Payer: Self-pay | Admitting: Family Medicine

## 2014-02-13 ENCOUNTER — Other Ambulatory Visit: Payer: Self-pay | Admitting: Physician Assistant

## 2014-02-15 ENCOUNTER — Other Ambulatory Visit: Payer: Self-pay | Admitting: Physician Assistant

## 2014-02-20 ENCOUNTER — Encounter: Payer: Self-pay | Admitting: Family Medicine

## 2014-02-20 ENCOUNTER — Ambulatory Visit (INDEPENDENT_AMBULATORY_CARE_PROVIDER_SITE_OTHER): Payer: Managed Care, Other (non HMO) | Admitting: Family Medicine

## 2014-02-20 VITALS — BP 168/81 | HR 93 | Temp 98.1°F | Resp 16 | Ht 70.75 in | Wt 339.8 lb

## 2014-02-20 DIAGNOSIS — Z1211 Encounter for screening for malignant neoplasm of colon: Secondary | ICD-10-CM

## 2014-02-20 DIAGNOSIS — G5791 Unspecified mononeuropathy of right lower limb: Secondary | ICD-10-CM

## 2014-02-20 DIAGNOSIS — M25562 Pain in left knee: Secondary | ICD-10-CM

## 2014-02-20 DIAGNOSIS — G5793 Unspecified mononeuropathy of bilateral lower limbs: Secondary | ICD-10-CM

## 2014-02-20 DIAGNOSIS — E1165 Type 2 diabetes mellitus with hyperglycemia: Secondary | ICD-10-CM

## 2014-02-20 DIAGNOSIS — IMO0002 Reserved for concepts with insufficient information to code with codable children: Secondary | ICD-10-CM

## 2014-02-20 DIAGNOSIS — E785 Hyperlipidemia, unspecified: Secondary | ICD-10-CM

## 2014-02-20 DIAGNOSIS — I10 Essential (primary) hypertension: Secondary | ICD-10-CM

## 2014-02-20 DIAGNOSIS — M25561 Pain in right knee: Secondary | ICD-10-CM

## 2014-02-20 LAB — POCT GLYCOSYLATED HEMOGLOBIN (HGB A1C): Hemoglobin A1C: 7

## 2014-02-20 LAB — GLUCOSE, POCT (MANUAL RESULT ENTRY): POC Glucose: 170 mg/dl — AB (ref 70–99)

## 2014-02-20 MED ORDER — ATORVASTATIN CALCIUM 40 MG PO TABS: ORAL_TABLET | ORAL | Status: DC

## 2014-02-20 MED ORDER — LISINOPRIL 5 MG PO TABS: 5.0000 mg | ORAL_TABLET | Freq: Every day | ORAL | Status: DC

## 2014-02-20 MED ORDER — GLIPIZIDE 5 MG PO TABS: 5.0000 mg | ORAL_TABLET | Freq: Two times a day (BID) | ORAL | Status: DC

## 2014-02-20 NOTE — Patient Instructions (Addendum)
Your A1C was still pretty well controlled today at 7.0 Keep taking your medications as prescribed for the diabetes and high cholesterol.  Your weight was up today from last visit, be sure to try to get that daily walk in with your dog to help control the weight and the blood sugar.  We drew some labs today and will be in contact with you with the results.  I've referred you for a colonoscopy and they will be in contact with you to schedule that.  We've refilled your meds for 6 months. We've started you on 5 mg lisinopril today for high blood pressure but also for kidney protection with your diabetes.  We'd like to see you back in 3 months to draw an A1C and see how you're doing. For your knee pain, light exercise and tylenol as needed may help.    Patellofemoral Syndrome If you have had pain in the front of your knee for a long time, chances are good that you have patellofemoral syndrome. The word patella refers to the kneecap. Femoral (or femur) refers to the thigh bone. That is the bone the kneecap sits on. The kneecap is shaped like a triangle. Its job is to protect the knee and to improve the efficiency of your thigh muscles (quadriceps). The underside of the kneecap is made of smooth tissue (cartilage). This lets the kneecap slide up and down as the knee moves. Sometimes this cartilage becomes soft. Your healthcare provider may say the cartilage breaks down. That is patellofemoral syndrome. It can affect one knee, or both. The condition is sometimes called patellofemoral pain syndrome. That is because the condition is painful. The pain usually gets worse with activity. Sitting for a long time with the knee bent also makes the pain worse. It usually gets better with rest and proper treatment. CAUSES  No one is sure why some people develop this problem and others do not. Runners often get it. One name for the condition is "runner's knee." However, some people run for years and never have knee pain.  Certain things seem to make patellofemoral syndrome more likely. They include:  Moving out of alignment. The kneecap is supposed to move in a straight line when the thigh muscle pulls on it. Sometimes the kneecap moves in poor alignment. That can make the knee swell and hurt. Some experts believe it also wears down the cartilage.  Injury to the kneecap.  Strain on the knee. This may occur during sports activity. Soccer, running, skiing and cycling can put excess stress on the knee.  Being flat-footed or knock-kneed. SYMPTOMS   Knee pain.  Pain under the kneecap. This is usually a dull, aching pain.  Pain in the knee when doing certain things: squatting, kneeling, going up or down stairs.  Pain in the knee when you stand up after sitting down for awhile.  Tightness in the knee.  Loss of muscle strength in the thigh.  Swelling of the knee. DIAGNOSIS  Healthcare providers often send people with knee pain to an orthopedic caregiver. This person has special training to treat problems with bones and joints. To decide what is causing your knee pain, your caregiver will probably:  Do a physical exam. This will probably include:  Asking about symptoms you have noticed.  Asking about your activities and any injuries.  Feeling your knee. Moving it. This will help test the knee's strength. It will also check alignment (whether the knee and leg are aligned normally).  Order some  tests, such as:  Imaging tests. They create pictures of the inside of the knee. Tests may include:  X-rays.  Computed tomography (CT) scan. This uses X-rays and a computer to show more detail.  Magnetic resonance imaging (MRI). This test uses magnets, radio waves and a computer to make pictures. TREATMENT   Medication is almost always used first. It can relieve pain. It also can reduce swelling. Non-steroidal anti-inflammatory medicines (called NSAIDs) are usually suggested. Sometimes a stronger form is  needed. A stronger form would require a prescription.  Other treatment may be needed after the swelling goes down. Possibilities include:  Exercise. Certain exercises can make the muscles around the knee stronger which decreases the pressure on the knee cap. This includes the thigh muscle. Certain exercises also may be suggested to increase your flexibility.  A knee brace. This gives the knee extra support and helps align the movement of the knee cap.  Orthotics. These are special shoe inserts. They can help keep your leg and knee aligned.  Surgery is sometimes needed. This is rare. Options include:  Arthroscopy. The surgeon uses a special tool to remove any damaged pieces of the kneecap. Only a few small incisions (cuts) are needed.  Realignment. This is open surgery. The goals are to reduce pressure and fix the way the kneecap moves. HOME CARE INSTRUCTIONS   Take any medication prescribed by your healthcare provider. Follow the directions carefully.  If your knee is swollen:  Put ice or cold packs on it. Do this for 20 to 30 minutes, 3 to 4 times a day.  Keep the knee raised. Make sure it is supported. Put a pillow under it.  Rest your knee. For example, take the elevator instead of the stairs for awhile. Or, take a break from sports activity that strain your knee. Try walking or swimming instead.  Whenever you are active:  Use an elastic bandage on your knee. This gives it support.  After any activity, put ice or cold packs on your knees. Do this for about 10 to 20 minutes.  Make sure you wear shoes that give good support. Make sure they are not worn down. The heels should not slant in or out. SEEK MEDICAL CARE IF:   Knee pain gets worse. Or it does not go away, even after taking pain medicine.  Swelling does not go down.  Your thigh muscle becomes weak.  You have an oral temperature above 102 F (38.9 C). SEEK IMMEDIATE MEDICAL CARE IF:  You have an oral temperature  above 102 F (38.9 C), not controlled by medicine. Document Released: 02/05/2009 Document Revised: 05/12/2011 Document Reviewed: 05/09/2013 [facility] Patient Information 2015 [place], [place]. This information is not intended to replace advice given to you by your health care provider. Make sure you discuss any questions you have with your health care provider.

## 2014-02-20 NOTE — Progress Notes (Signed)
Subjective:    Patient ID: Jay [name], male    DOB: 29-Jan-1956, 58 y.o.   MRN: [id number]  PCP: Wendie Agreste, MD  Chief Complaint  Patient presents with  . Diabetes  . Medication Refill    lipitor and glipizide  . Knee Pain    left knee hurths more than the right knee   Patient Active Problem List   Diagnosis Date Noted  . DM2 (diabetes mellitus, type 2) 06/13/2013  . Severe obesity (BMI >= 40) 06/13/2013   Prior to Admission medications   Medication Sig Start Date End Date Taking? Authorizing Provider  aspirin 81 MG tablet Take 81 mg by mouth daily.   Yes Historical Provider, MD  atorvastatin (LIPITOR) 40 MG tablet Take 1 tablet (40 mg total) by mouth at bedtime. 06/13/13  Yes Wendie Agreste, MD  atorvastatin (LIPITOR) 40 MG tablet TAKE 1 TABLET AT BEDTIME DAILY NEED OFFICE VISIT FOR FURTHER REFILLS 02/20/14  Yes Maurizio Geno, PA  glipiZIDE (GLUCOTROL) 5 MG tablet Take 1 tablet (5 mg total) by mouth 2 (two) times daily. 02/20/14  Yes Anahis Furgeson, PA  glucose blood (FREESTYLE LITE) test strip Use as directed. PLEASE COME IN FOR DIABETES CHECK UP 02/05/14  Yes Mancel Bale, PA-C  glucose monitoring kit (FREESTYLE) monitoring kit Patient may pick any glucometer he prefers. Use as directed. 10/21/12  Yes Thao P Le, DO  Lancets (FREESTYLE) lancets Patient preference. Use as instructed 10/21/12  Yes Thao P Le, DO  metFORMIN (GLUCOPHAGE) 1000 MG tablet Take 1 tablet (1,000 mg total) by mouth 2 (two) times daily with a meal. 06/13/13  Yes Wendie Agreste, MD  OVER THE COUNTER MEDICATION Fish Oil 1200 mg taking everyday.   Yes Historical Provider, MD  OVER THE COUNTER MEDICATION at bedtime.   Yes Historical Provider, MD  PRESCRIPTION MEDICATION Travatan 0.004% taking one drop each eye at night    Historical Provider, MD   Medications, allergies, past medical history, surgical history, family history, social history and problem list reviewed and updated.  HPI  58 yom with pmh  dm2, hyperlipidemia, and obesity presents for diabetes follow up visit.  Last saw Dr Carlota Raspberry for diabetes f/u in 4/15. A1C at that time was 6.9, down from 13.0 4 months earlier.   Today he states he is doing well but has not been exercising at all since last visit in 4/15 as he has been busy with work. He is up 19# today from last visit in April. He attributes this to stress, lack of exercise, and being in the holiday season. Has been taking his meds as prescribed. He did run out of glipizide last week as he was busy. Has not really been watching his diet. Checks his BG at home every morning has been running 100-120s. He denies any polyuria, polydipsia, headache, or syncopal episodes.   Mentions both knees have been bothering him with activity, left more than right. Has not taken anything for it. No acute injury.   BP elevated today at 168/81. Has been well controlled at all prior visits. Pt does not feel anxious or stressed in clinic today. Denies cp, sob, palps, presyncope, syncope.   He mentions today that he has had both occasional tingling and numbness in his toes (both feet) for the last several months.   He is due for a colonoscopy as he has never had one. Due for flu, tdap, and pneumonia vaccines but declines today.   Review of Systems No fever,  chills, constipation, diarrhea. No HA.     Objective:   Physical Exam  Constitutional: He is oriented to person, place, and time. He appears well-developed and well-nourished.  Non-toxic appearance. He does not have a sickly appearance. He does not appear ill. No distress.  BP 168/81 mmHg  Pulse 93  Temp(Src) 98.1 F (36.7 C) (Oral)  Resp 16  Ht 5' 10.75" (1.797 m)  Wt 339 lb 12.8 oz (154.132 kg)  BMI 47.73 kg/m2  SpO2 96%   Cardiovascular: Normal rate, regular rhythm, S1 normal, S2 normal and normal heart sounds.  Exam reveals no gallop.   No murmur heard. Pulses:      Dorsalis pedis pulses are 2+ on the right side, and 2+ on the left  side.       Posterior tibial pulses are 2+ on the right side, and 2+ on the left side.  Neurological: He is alert and oriented to person, place, and time. No sensory deficit.  Skin:  Hyperpigmentation with venous stasis changes from ankle to mid-shin bilaterally, left worse than right. No ulcerations, no scaling, no surrounding erythema, no purulence. 2+ pitting edema to mid-shin bilaterally, left worse than right.   Psychiatric: He has a normal mood and affect. His speech is normal.   Results for orders placed or performed in visit on 02/20/14  POCT glucose (manual entry)  Result Value Ref Range   POC Glucose 170 (A) 70 - 99 mg/dl  POCT glycosylated hemoglobin (Hb A1C)  Result Value Ref Range   Hemoglobin A1C 7.0       Assessment & Plan:   70 yom with pmh dm2, hyperlipidemia, and obesity presents for diabetes follow up visit.  DM (diabetes mellitus), type 2, uncontrolled - Plan: POCT glucose (manual entry), POCT glycosylated hemoglobin (Hb A1C), HM Diabetes Foot Exam, glipiZIDE (GLUCOTROL) 5 MG tablet, Microalbumin, urine, Basic metabolic panel, lisinopril (PRINIVIL,ZESTRIL) 5 MG tablet --a1c with decent control at 7.0 --continue current regimen metformin, glipizide --encouraged exercise for diabetes control and weight control --lisinopril for renal protection, bmp for scr and k --uma today, monofilament testing today --declines pneumo vaccine --rtc 3 months  Hyperlipidemia - Plan: atorvastatin (LIPITOR) 40 MG tablet --last drawn 4/15 --continue lipitor  Special screening for malignant neoplasms, colon - Plan: Ambulatory referral to Gastroenterology --referred for colonoscopy  Essential hypertension - Plan: Basic metabolic panel, lisinopril (PRINIVIL,ZESTRIL) 5 MG tablet --elevated in clinic today --5 mg lisinopril qd  Knee pain, bilateral --likely oa --tylenol as needed, light exercise and rom exercises encouraged --wt loss encouraged  Neuropathy of both  feet --diabetic neuropathy vs effect of le edema --monitor for now, encouraged exercise and wt loss  Julieta Gutting, PA-C Physician Assistant-Certified [facility]  02/20/2014 8:11 PM

## 2014-02-21 ENCOUNTER — Other Ambulatory Visit: Payer: Self-pay

## 2014-02-21 DIAGNOSIS — E1165 Type 2 diabetes mellitus with hyperglycemia: Secondary | ICD-10-CM

## 2014-02-21 DIAGNOSIS — IMO0002 Reserved for concepts with insufficient information to code with codable children: Secondary | ICD-10-CM

## 2014-02-21 LAB — BASIC METABOLIC PANEL
BUN: 17 mg/dL (ref 6–23)
CALCIUM: 9.4 mg/dL (ref 8.4–10.5)
CO2: 28 mEq/L (ref 19–32)
CREATININE: 0.96 mg/dL (ref 0.50–1.35)
Chloride: 105 mEq/L (ref 96–112)
GLUCOSE: 148 mg/dL — AB (ref 70–99)
Potassium: 4.5 mEq/L (ref 3.5–5.3)
SODIUM: 144 meq/L (ref 135–145)

## 2014-02-21 LAB — MICROALBUMIN, URINE: Microalb, Ur: 70.6 mg/dL — ABNORMAL HIGH (ref <2.0)

## 2014-02-21 MED ORDER — GLIPIZIDE 5 MG PO TABS: 5.0000 mg | ORAL_TABLET | Freq: Two times a day (BID) | ORAL | Status: DC

## 2014-02-26 NOTE — Progress Notes (Signed)
Patient discussed and examined with Mr. Rosanne Sack. Agree with assessment and plan of care per his note.  May have some patellofemoral pain syndrome by exam vs. OA. Trial of ROM, tylenol if needed, progress to HEP, and wt loss ultimately may help. rtc precautions. As diabetes stable now, may be able to space out visit to 6 months, but need for routine follow up of diabetes discussed.

## 2014-02-27 ENCOUNTER — Encounter: Payer: Self-pay | Admitting: Internal Medicine

## 2014-03-29 LAB — HM DIABETES EYE EXAM

## 2014-04-12 ENCOUNTER — Ambulatory Visit (AMBULATORY_SURGERY_CENTER): Payer: Self-pay | Admitting: *Deleted

## 2014-04-12 VITALS — Ht 71.0 in | Wt 345.2 lb

## 2014-04-12 DIAGNOSIS — Z1211 Encounter for screening for malignant neoplasm of colon: Secondary | ICD-10-CM

## 2014-04-12 MED ORDER — MOVIPREP 100 G PO SOLR: 1.0000 | Freq: Once | ORAL | Status: DC

## 2014-04-12 NOTE — Progress Notes (Signed)
No egg or soy allergy No diet pills No home 02 use No issues with past sedation Pt declined emmi- he doesn't want to see it

## 2014-04-26 ENCOUNTER — Encounter: Payer: Self-pay | Admitting: Internal Medicine

## 2014-04-26 ENCOUNTER — Ambulatory Visit (AMBULATORY_SURGERY_CENTER): Payer: Managed Care, Other (non HMO) | Admitting: Internal Medicine

## 2014-04-26 VITALS — BP 111/66 | HR 76 | Temp 97.2°F | Resp 18 | Ht 71.0 in | Wt 345.0 lb

## 2014-04-26 DIAGNOSIS — K635 Polyp of colon: Secondary | ICD-10-CM

## 2014-04-26 DIAGNOSIS — Z1211 Encounter for screening for malignant neoplasm of colon: Secondary | ICD-10-CM

## 2014-04-26 DIAGNOSIS — D120 Benign neoplasm of cecum: Secondary | ICD-10-CM

## 2014-04-26 LAB — GLUCOSE, CAPILLARY
Glucose-Capillary: 120 mg/dL — ABNORMAL HIGH (ref 70–99)
Glucose-Capillary: 110 mg/dL — ABNORMAL HIGH (ref 70–99)

## 2014-04-26 MED ORDER — SODIUM CHLORIDE 0.9 % IV SOLN: 500.0000 mL | INTRAVENOUS | Status: DC

## 2014-04-26 NOTE — Op Note (Signed)
[place]  [address] [place], [address]   COLONOSCOPY PROCEDURE REPORT  PATIENT: Jay [name], Jay [name]  MR#: [id number] BIRTHDATE: December 07, 1955 , 25  yrs. old GENDER: male ENDOSCOPIST: Jerene Bears, MD REFERRED EV:[id number] Greene, M.D. PROCEDURE DATE:  04/26/2014 PROCEDURE:   Colonoscopy with cold biopsy polypectomy First Screening Colonoscopy - Avg.  risk and is 50 yrs.  old or older Yes.  Prior Negative Screening - Now for repeat screening. N/A  History of Adenoma - Now for follow-up colonoscopy & has been > or = to 3 yrs.  N/A  Polyps Removed Today? Yes. ASA CLASS:   Class III INDICATIONS:average risk patient for colon cancer and 1st colonoscopy. MEDICATIONS: Monitored anesthesia care and Propofol 300 mg IV  DESCRIPTION OF PROCEDURE:   After the risks benefits and alternatives of the procedure were thoroughly explained, informed consent was obtained.  The digital rectal exam revealed no rectal mass.   The [id number]  endoscope was introduced through the anus and advanced to the cecum, which was identified by both the appendix and ileocecal valve. No adverse events experienced. The quality of the prep was good, using MoviPrep  The instrument was then slowly withdrawn as the colon was fully examined.   COLON FINDINGS: A sessile polyp measuring 3 mm in size was found at the cecum.  A polypectomy was performed with cold forceps.  The resection was complete, the polyp tissue was completely retrieved and sent to histology.   There was mild diverticulosis noted in the sigmoid colon and ascending colon.  Retroflexed views revealed no abnormalities. The time to cecum=3 minutes 50 seconds.  Withdrawal time=16 minutes 58 seconds.  The scope was withdrawn and the procedure completed. COMPLICATIONS: There were no immediate complications.  ENDOSCOPIC IMPRESSION: 1.   Sessile polyp was found at the cecum; polypectomy was performed with cold forceps 2.   There was  mild diverticulosis noted in the sigmoid colon and ascending colon  RECOMMENDATIONS: 1.  Await pathology results 2.  If the polyp removed today is proven to be an adenomatous (pre-cancerous) polyp, you will need a repeat colonoscopy in 5 years.  Otherwise you should continue to follow colorectal cancer screening guidelines for "routine risk" patients with colonoscopy in 10 years.  You will receive a letter within 1-2 weeks with the results of your biopsy as well as final recommendations.  Please call my office if you have not received a letter after 3 weeks.  eSigned:  Jerene Bears, MD 04/26/2014 11:29 AM   cc: [name], MD and The Patient

## 2014-04-26 NOTE — Patient Instructions (Signed)
YOU HAD AN ENDOSCOPIC PROCEDURE TODAY AT THE [facility]: Refer to the procedure report that was given to you for any specific questions about what was found during the examination.  If the procedure report does not answer your questions, please call your gastroenterologist to clarify.  If you requested that your care partner not be given the details of your procedure findings, then the procedure report has been included in a sealed envelope for you to review at your convenience later.  YOU SHOULD EXPECT: Some feelings of bloating in the abdomen. Passage of more gas than usual.  Walking can help get rid of the air that was put into your GI tract during the procedure and reduce the bloating. If you had a lower endoscopy (such as a colonoscopy or flexible sigmoidoscopy) you may notice spotting of blood in your stool or on the toilet paper. If you underwent a bowel prep for your procedure, then you may not have a normal bowel movement for a few days.  DIET: Your first meal following the procedure should be a light meal and then it is ok to progress to your normal diet.  A half-sandwich or bowl of soup is an example of a good first meal.  Heavy or fried foods are harder to digest and may make you feel nauseous or bloated.  Likewise meals heavy in dairy and vegetables can cause extra gas to form and this can also increase the bloating.  Drink plenty of fluids but you should avoid alcoholic beverages for 24 hours.  ACTIVITY: Your care partner should take you home directly after the procedure.  You should plan to take it easy, moving slowly for the rest of the day.  You can resume normal activity the day after the procedure however you should NOT DRIVE or use heavy machinery for 24 hours (because of the sedation medicines used during the test).    SYMPTOMS TO REPORT IMMEDIATELY: A gastroenterologist can be reached at any hour.  During normal business hours, 8:30 AM to 5:00 PM Monday through Friday,  call [phone].  After hours and on weekends, please call the GI answering service at [phone] who will take a message and have the physician on call contact you.   Following lower endoscopy (colonoscopy or flexible sigmoidoscopy):  Excessive amounts of blood in the stool  Significant tenderness or worsening of abdominal pains  Swelling of the abdomen that is new, acute  Fever of 100F or higher  FOLLOW UP: If any biopsies were taken you will be contacted by phone or by letter within the next 1-3 weeks.  Call your gastroenterologist if you have not heard about the biopsies in 3 weeks.  Our staff will call the home number listed on your records the next business day following your procedure to check on you and address any questions or concerns that you may have at that time regarding the information given to you following your procedure. This is a courtesy call and so if there is no answer at the home number and we have not heard from you through the emergency physician on call, we will assume that you have returned to your regular daily activities without incident.  SIGNATURES/CONFIDENTIALITY: You and/or your care partner have signed paperwork which will be entered into your electronic medical record.  These signatures attest to the fact that that the information above on your After Visit Summary has been reviewed and is understood.  Full responsibility of the confidentiality of this   discharge information lies with you and/or your care-partner.  Recommendations Next colonoscopy determined by pathology results; 5 or 10 years. Discharge instructions given to patient and/or care partner. Polyp, diverticulosis, and high fiber diet handouts provided.

## 2014-04-26 NOTE — Progress Notes (Signed)
A/ox3 pleased with MAC, report to Wendy RN 

## 2014-04-26 NOTE — Progress Notes (Signed)
Called to room to assist during endoscopic procedure.  Patient ID and intended procedure confirmed with present staff. Received instructions for my participation in the procedure from the performing physician.  

## 2014-04-27 ENCOUNTER — Telehealth: Payer: Self-pay | Admitting: *Deleted

## 2014-04-27 NOTE — Telephone Encounter (Signed)
Unable to leave message on f/u call,sounded as someone picked up but did not hear anyone

## 2014-05-01 ENCOUNTER — Other Ambulatory Visit: Payer: Self-pay | Admitting: Family Medicine

## 2014-05-01 ENCOUNTER — Encounter: Payer: Self-pay | Admitting: Internal Medicine

## 2014-05-10 ENCOUNTER — Other Ambulatory Visit: Payer: Self-pay | Admitting: Family Medicine

## 2014-05-10 ENCOUNTER — Other Ambulatory Visit: Payer: Self-pay | Admitting: Physician Assistant

## 2014-05-29 ENCOUNTER — Encounter: Payer: Self-pay | Admitting: Family Medicine

## 2014-05-29 ENCOUNTER — Ambulatory Visit (INDEPENDENT_AMBULATORY_CARE_PROVIDER_SITE_OTHER): Payer: Managed Care, Other (non HMO)

## 2014-05-29 ENCOUNTER — Ambulatory Visit (INDEPENDENT_AMBULATORY_CARE_PROVIDER_SITE_OTHER): Payer: Managed Care, Other (non HMO) | Admitting: Family Medicine

## 2014-05-29 VITALS — BP 136/86 | HR 94 | Temp 98.4°F | Resp 18 | Ht 70.5 in | Wt 339.8 lb

## 2014-05-29 DIAGNOSIS — M791 Myalgia, unspecified site: Secondary | ICD-10-CM

## 2014-05-29 DIAGNOSIS — E785 Hyperlipidemia, unspecified: Secondary | ICD-10-CM | POA: Diagnosis not present

## 2014-05-29 DIAGNOSIS — E119 Type 2 diabetes mellitus without complications: Secondary | ICD-10-CM | POA: Diagnosis not present

## 2014-05-29 DIAGNOSIS — E1165 Type 2 diabetes mellitus with hyperglycemia: Secondary | ICD-10-CM | POA: Diagnosis not present

## 2014-05-29 DIAGNOSIS — M25562 Pain in left knee: Secondary | ICD-10-CM

## 2014-05-29 DIAGNOSIS — I10 Essential (primary) hypertension: Secondary | ICD-10-CM

## 2014-05-29 DIAGNOSIS — R197 Diarrhea, unspecified: Secondary | ICD-10-CM | POA: Diagnosis not present

## 2014-05-29 DIAGNOSIS — IMO0002 Reserved for concepts with insufficient information to code with codable children: Secondary | ICD-10-CM

## 2014-05-29 LAB — COMPLETE METABOLIC PANEL WITH GFR
ALK PHOS: 77 U/L (ref 39–117)
ALT: 48 U/L (ref 0–53)
AST: 25 U/L (ref 0–37)
Albumin: 4.1 g/dL (ref 3.5–5.2)
BUN: 17 mg/dL (ref 6–23)
CO2: 25 meq/L (ref 19–32)
CREATININE: 0.91 mg/dL (ref 0.50–1.35)
Calcium: 9.4 mg/dL (ref 8.4–10.5)
Chloride: 105 mEq/L (ref 96–112)
GLUCOSE: 79 mg/dL (ref 70–99)
Potassium: 4.3 mEq/L (ref 3.5–5.3)
Sodium: 140 mEq/L (ref 135–145)
Total Bilirubin: 0.4 mg/dL (ref 0.2–1.2)
Total Protein: 6.6 g/dL (ref 6.0–8.3)

## 2014-05-29 LAB — LIPID PANEL
CHOL/HDL RATIO: 4.2 ratio
Cholesterol: 147 mg/dL (ref 0–200)
HDL: 35 mg/dL — AB (ref >=40)
LDL CALC: 80 mg/dL (ref 0–99)
Triglycerides: 162 mg/dL — ABNORMAL HIGH (ref <150)
VLDL: 32 mg/dL (ref 0–40)

## 2014-05-29 LAB — GLUCOSE, POCT (MANUAL RESULT ENTRY): POC GLUCOSE: 85 mg/dL (ref 70–99)

## 2014-05-29 LAB — CK: Total CK: 199 U/L (ref 7–232)

## 2014-05-29 LAB — POCT GLYCOSYLATED HEMOGLOBIN (HGB A1C): HEMOGLOBIN A1C: 7

## 2014-05-29 MED ORDER — MELOXICAM 7.5 MG PO TABS: 7.5000 mg | ORAL_TABLET | Freq: Every day | ORAL | Status: DC | PRN

## 2014-05-29 MED ORDER — LISINOPRIL 5 MG PO TABS: 5.0000 mg | ORAL_TABLET | Freq: Every day | ORAL | Status: DC

## 2014-05-29 MED ORDER — GLIPIZIDE 5 MG PO TABS: 5.0000 mg | ORAL_TABLET | Freq: Two times a day (BID) | ORAL | Status: DC

## 2014-05-29 MED ORDER — ATORVASTATIN CALCIUM 40 MG PO TABS: ORAL_TABLET | ORAL | Status: DC

## 2014-05-29 MED ORDER — METFORMIN HCL 1000 MG PO TABS: 1000.0000 mg | ORAL_TABLET | Freq: Two times a day (BID) | ORAL | Status: DC

## 2014-05-29 NOTE — Patient Instructions (Addendum)
Start tylenol for your knee, if this does not help - try the mobic each morning (do not combine with other over the counter pain relievers),  I will check a muscle enzyme test, but suspect this will be normal. If pain persists, I can refer you to an orthopaedist for possible injections.  I included information on possible patellofemoral syndrome below as well.   For diarrhea - increase fiber (through whole grain and vegetables in diet, or can add Citrucel) and water in the diet. If this is not helping in next week, consider decreased metformin dose. See other information below. Return to the clinic or go to the nearest emergency room if any of your symptoms worsen or new symptoms occur.  No change in diabetes meds for now.   Diarrhea Diarrhea is frequent loose and watery bowel movements. It can cause you to feel weak and dehydrated. Dehydration can cause you to become tired and thirsty, have a dry mouth, and have decreased urination that often is dark yellow. Diarrhea is a sign of another problem, most often an infection that will not last long. In most cases, diarrhea typically lasts 2-3 days. However, it can last longer if it is a sign of something more serious. It is important to treat your diarrhea as directed by your caregiver to lessen or prevent future episodes of diarrhea. CAUSES  Some common causes include:  Gastrointestinal infections caused by viruses, bacteria, or parasites.  Food poisoning or food allergies.  Certain medicines, such as antibiotics, chemotherapy, and laxatives.  Artificial sweeteners and fructose.  Digestive disorders. HOME CARE INSTRUCTIONS  Ensure adequate fluid intake (hydration): Have 1 cup (8 oz) of fluid for each diarrhea episode. Avoid fluids that contain simple sugars or sports drinks, fruit juices, whole milk products, and sodas. Your urine should be clear or pale yellow if you are drinking enough fluids. Hydrate with an oral rehydration solution that you  can purchase at pharmacies, retail stores, and online. You can prepare an oral rehydration solution at home by mixing the following ingredients together:   - tsp table salt.   tsp baking soda.   tsp salt substitute containing potassium chloride.  1  tablespoons sugar.  1 L (34 oz) of water.  Certain foods and beverages may increase the speed at which food moves through the gastrointestinal (GI) tract. These foods and beverages should be avoided and include:  Caffeinated and alcoholic beverages.  High-fiber foods, such as raw fruits and vegetables, nuts, seeds, and whole grain breads and cereals.  Foods and beverages sweetened with sugar alcohols, such as xylitol, sorbitol, and mannitol.  Some foods may be well tolerated and may help thicken stool including:  Starchy foods, such as rice, toast, pasta, low-sugar cereal, oatmeal, grits, baked potatoes, crackers, and bagels.  Bananas.  Applesauce.  Add probiotic-rich foods to help increase healthy bacteria in the GI tract, such as yogurt and fermented milk products.  Wash your hands well after each diarrhea episode.  Only take over-the-counter or prescription medicines as directed by your caregiver.  Take a warm bath to relieve any burning or pain from frequent diarrhea episodes. SEEK IMMEDIATE MEDICAL CARE IF:   You are unable to keep fluids down.  You have persistent vomiting.  You have blood in your stool, or your stools are black and tarry.  You do not urinate in 6-8 hours, or there is only a small amount of very dark urine.  You have abdominal pain that increases or localizes.  You have  weakness, dizziness, confusion, or light-headedness.  You have a severe headache.  Your diarrhea gets worse or does not get better.  You have a fever or persistent symptoms for more than 2-3 days.  You have a fever and your symptoms suddenly get worse. MAKE SURE YOU:   Understand these instructions.  Will watch your  condition.  Will get help right away if you are not doing well or get worse. Document Released: 02/07/2002 Document Revised: 07/04/2013 Document Reviewed: 10/26/2011 [facility] Patient Information 2015 [place], [place]. This information is not intended to replace advice given to you by your health care provider. Make sure you discuss any questions you have with your health care provider.   Knee Pain The knee is the complex joint between your thigh and your lower leg. It is made up of bones, tendons, ligaments, and cartilage. The bones that make up the knee are:  The femur in the thigh.  The tibia and fibula in the lower leg.  The patella or kneecap riding in the groove on the lower femur. CAUSES  Knee pain is a common complaint with many causes. A few of these causes are:  Injury, such as:  A ruptured ligament or tendon injury.  Torn cartilage.  Medical conditions, such as:  Gout  Arthritis  Infections  Overuse, over training, or overdoing a physical activity. Knee pain can be minor or severe. Knee pain can accompany debilitating injury. Minor knee problems often respond well to self-care measures or get well on their own. More serious injuries may need medical intervention or even surgery. SYMPTOMS The knee is complex. Symptoms of knee problems can vary widely. Some of the problems are:  Pain with movement and weight bearing.  Swelling and tenderness.  Buckling of the knee.  Inability to straighten or extend your knee.  Your knee locks and you cannot straighten it.  Warmth and redness with pain and fever.  Deformity or dislocation of the kneecap. DIAGNOSIS  Determining what is wrong may be very straight forward such as when there is an injury. It can also be challenging because of the complexity of the knee. Tests to make a diagnosis may include:  Your caregiver taking a history and doing a physical exam.  Routine X-rays can be used to rule out other problems.  X-rays will not reveal a cartilage tear. Some injuries of the knee can be diagnosed by:  Arthroscopy a surgical technique by which a small video camera is inserted through tiny incisions on the sides of the knee. This procedure is used to examine and repair internal knee joint problems. Tiny instruments can be used during arthroscopy to repair the torn knee cartilage (meniscus).  Arthrography is a radiology technique. A contrast liquid is directly injected into the knee joint. Internal structures of the knee joint then become visible on X-ray film.  An MRI scan is a non X-ray radiology procedure in which magnetic fields and a computer produce two- or three-dimensional images of the inside of the knee. Cartilage tears are often visible using an MRI scanner. MRI scans have largely replaced arthrography in diagnosing cartilage tears of the knee.  Blood work.  Examination of the fluid that helps to lubricate the knee joint (synovial fluid). This is done by taking a sample out using a needle and a syringe. TREATMENT The treatment of knee problems depends on the cause. Some of these treatments are:  Depending on the injury, proper casting, splinting, surgery, or physical therapy care will be needed.  Give yourself adequate recovery time. Do not overuse your joints. If you begin to get sore during workout routines, back off. Slow down or do fewer repetitions.  For repetitive activities such as cycling or running, maintain your strength and nutrition.  Alternate muscle groups. For example, if you are a weight lifter, work the upper body on one day and the lower body the next.  Either tight or weak muscles do not give the proper support for your knee. Tight or weak muscles do not absorb the stress placed on the knee joint. Keep the muscles surrounding the knee strong.  Take care of mechanical problems.  If you have flat feet, orthotics or special shoes may help. See your caregiver if you need  help.  Arch supports, sometimes with wedges on the inner or outer aspect of the heel, can help. These can shift pressure away from the side of the knee most bothered by osteoarthritis.  A brace called an "unloader" brace also may be used to help ease the pressure on the most arthritic side of the knee.  If your caregiver has prescribed crutches, braces, wraps or ice, use as directed. The acronym for this is PRICE. This means protection, rest, ice, compression, and elevation.  Nonsteroidal anti-inflammatory drugs (NSAIDs), can help relieve pain. But if taken immediately after an injury, they may actually increase swelling. Take NSAIDs with food in your stomach. Stop them if you develop stomach problems. Do not take these if you have a history of ulcers, stomach pain, or bleeding from the bowel. Do not take without your caregiver's approval if you have problems with fluid retention, heart failure, or kidney problems.  For ongoing knee problems, physical therapy may be helpful.  Glucosamine and chondroitin are over-the-counter dietary supplements. Both may help relieve the pain of osteoarthritis in the knee. These medicines are different from the usual anti-inflammatory drugs. Glucosamine may decrease the rate of cartilage destruction.  Injections of a corticosteroid drug into your knee joint may help reduce the symptoms of an arthritis flare-up. They may provide pain relief that lasts a few months. You may have to wait a few months between injections. The injections do have a small increased risk of infection, water retention, and elevated blood sugar levels.  Hyaluronic acid injected into damaged joints may ease pain and provide lubrication. These injections may work by reducing inflammation. A series of shots may give relief for as long as 6 months.  Topical painkillers. Applying certain ointments to your skin may help relieve the pain and stiffness of osteoarthritis. Ask your pharmacist for  suggestions. Many over the-counter products are approved for temporary relief of arthritis pain.  In some countries, doctors often prescribe topical NSAIDs for relief of chronic conditions such as arthritis and tendinitis. A review of treatment with NSAID creams found that they worked as well as oral medications but without the serious side effects. PREVENTION  Maintain a healthy weight. Extra pounds put more strain on your joints.  Get strong, stay limber. Weak muscles are a common cause of knee injuries. Stretching is important. Include flexibility exercises in your workouts.  Be smart about exercise. If you have osteoarthritis, chronic knee pain or recurring injuries, you may need to change the way you exercise. This does not mean you have to stop being active. If your knees ache after jogging or playing basketball, consider switching to swimming, water aerobics, or other low-impact activities, at least for a few days a week. Sometimes limiting high-impact activities will provide  relief.  Make sure your shoes fit well. Choose footwear that is right for your sport.  Protect your knees. Use the proper gear for knee-sensitive activities. Use kneepads when playing volleyball or laying carpet. Buckle your seat belt every time you drive. Most shattered kneecaps occur in car accidents.  Rest when you are tired. SEEK MEDICAL CARE IF:  You have knee pain that is continual and does not seem to be getting better.  SEEK IMMEDIATE MEDICAL CARE IF:  Your knee joint feels hot to the touch and you have a high fever. MAKE SURE YOU:   Understand these instructions.  Will watch your condition.  Will get help right away if you are not doing well or get worse. Document Released: 12/15/2006 Document Revised: 05/12/2011 Document Reviewed: 12/15/2006 [facility] Patient Information 2015 [place], [place]. This information is not intended to replace advice given to you by your health care provider. Make sure you  discuss any questions you have with your health care provider.   Patellofemoral Syndrome If you have had pain in the front of your knee for a long time, chances are good that you have patellofemoral syndrome. The word patella refers to the kneecap. Femoral (or femur) refers to the thigh bone. That is the bone the kneecap sits on. The kneecap is shaped like a triangle. Its job is to protect the knee and to improve the efficiency of your thigh muscles (quadriceps). The underside of the kneecap is made of smooth tissue (cartilage). This lets the kneecap slide up and down as the knee moves. Sometimes this cartilage becomes soft. Your healthcare provider may say the cartilage breaks down. That is patellofemoral syndrome. It can affect one knee, or both. The condition is sometimes called patellofemoral pain syndrome. That is because the condition is painful. The pain usually gets worse with activity. Sitting for a long time with the knee bent also makes the pain worse. It usually gets better with rest and proper treatment. CAUSES  No one is sure why some people develop this problem and others do not. Runners often get it. One name for the condition is "runner's knee." However, some people run for years and never have knee pain. Certain things seem to make patellofemoral syndrome more likely. They include:  Moving out of alignment. The kneecap is supposed to move in a straight line when the thigh muscle pulls on it. Sometimes the kneecap moves in poor alignment. That can make the knee swell and hurt. Some experts believe it also wears down the cartilage.  Injury to the kneecap.  Strain on the knee. This may occur during sports activity. Soccer, running, skiing and cycling can put excess stress on the knee.  Being flat-footed or knock-kneed. SYMPTOMS   Knee pain.  Pain under the kneecap. This is usually a dull, aching pain.  Pain in the knee when doing certain things: squatting, kneeling, going up or  down stairs.  Pain in the knee when you stand up after sitting down for awhile.  Tightness in the knee.  Loss of muscle strength in the thigh.  Swelling of the knee. DIAGNOSIS  Healthcare providers often send people with knee pain to an orthopedic caregiver. This person has special training to treat problems with bones and joints. To decide what is causing your knee pain, your caregiver will probably:  Do a physical exam. This will probably include:  Asking about symptoms you have noticed.  Asking about your activities and any injuries.  Feeling your knee. Moving it.  This will help test the knee's strength. It will also check alignment (whether the knee and leg are aligned normally).  Order some tests, such as:  Imaging tests. They create pictures of the inside of the knee. Tests may include:  X-rays.  Computed tomography (CT) scan. This uses X-rays and a computer to show more detail.  Magnetic resonance imaging (MRI). This test uses magnets, radio waves and a computer to make pictures. TREATMENT   Medication is almost always used first. It can relieve pain. It also can reduce swelling. Non-steroidal anti-inflammatory medicines (called NSAIDs) are usually suggested. Sometimes a stronger form is needed. A stronger form would require a prescription.  Other treatment may be needed after the swelling goes down. Possibilities include:  Exercise. Certain exercises can make the muscles around the knee stronger which decreases the pressure on the knee cap. This includes the thigh muscle. Certain exercises also may be suggested to increase your flexibility.  A knee brace. This gives the knee extra support and helps align the movement of the knee cap.  Orthotics. These are special shoe inserts. They can help keep your leg and knee aligned.  Surgery is sometimes needed. This is rare. Options include:  Arthroscopy. The surgeon uses a special tool to remove any damaged pieces of the  kneecap. Only a few small incisions (cuts) are needed.  Realignment. This is open surgery. The goals are to reduce pressure and fix the way the kneecap moves. HOME CARE INSTRUCTIONS   Take any medication prescribed by your healthcare provider. Follow the directions carefully.  If your knee is swollen:  Put ice or cold packs on it. Do this for 20 to 30 minutes, 3 to 4 times a day.  Keep the knee raised. Make sure it is supported. Put a pillow under it.  Rest your knee. For example, take the elevator instead of the stairs for awhile. Or, take a break from sports activity that strain your knee. Try walking or swimming instead.  Whenever you are active:  Use an elastic bandage on your knee. This gives it support.  After any activity, put ice or cold packs on your knees. Do this for about 10 to 20 minutes.  Make sure you wear shoes that give good support. Make sure they are not worn down. The heels should not slant in or out. SEEK MEDICAL CARE IF:   Knee pain gets worse. Or it does not go away, even after taking pain medicine.  Swelling does not go down.  Your thigh muscle becomes weak.  You have an oral temperature above 102 F (38.9 C). SEEK IMMEDIATE MEDICAL CARE IF:  You have an oral temperature above 102 F (38.9 C), not controlled by medicine. Document Released: 02/05/2009 Document Revised: 05/12/2011 Document Reviewed: 05/09/2013 [facility] Patient Information 2015 [place], [place]. This information is not intended to replace advice given to you by your health care provider. Make sure you discuss any questions you have with your health care provider.

## 2014-05-29 NOTE — Progress Notes (Signed)
Subjective:    Patient ID: Jay [name], male    DOB: 06/23/1955, 59 y.o.   MRN: [id number]  HPI Jay [name] is a 59 y.o. male Here for scheduled follow up for diabetes bu has 2 other concerns today as well.   Diabetes - lat A1c overall controlled at 7.0. Continued on metformin and glipizide at same doses.  Lab Results  Component Value Date   HGBA1C 7.0 02/20/2014   Lab Results  Component Value Date   CREATININE 0.96 02/20/2014   Lab Results  Component Value Date   MICROALBUR 70.6* 02/20/2014   Lab Results  Component Value Date   CHOL 128 06/13/2013   HDL 32* 06/13/2013   LDLCALC 76 06/13/2013   TRIG 101 06/13/2013   CHOLHDL 4.0 06/13/2013   takes lisinopril for nephroprotection, on ASA QD, and lipitor as statin. possible peripheral neuropathy vs LE edema effect noted last OV. Microfilament testing was ok. Declined pneumovax last ov.  Home blood sugars around 120. No symptomatic lows recently. Is taking glipizide and metfomin BID. Has not been able to exercise. Last po about 8 hours ago.   Diarrhea -   -had occasional diarrhea prior to colonoscopy. noticed more often since having colonoscopy about 6 weeks.  Has hard stools at time or possible mild constipation, then pushed out by diarrhea.  Can have up to 4 times in a row, then none for awhile. No unexplained wt loss, nightt sweats or fevers. No abdominal pain. Not taking any treatments for this. No recent abx's/hospitalizations.   Knee pain.  - discussed last ov. OA vs patellofemoral syndrome. Trial of tylenol and light exercise recommended at visit in 01/2014.  -primarily L knee now. Has been trying some heat - improved, similar sx's off and on.  Stiffness on outside, and occasionally feels unstable. catching sensation every few days. NKI. Not taking tylenol or other treatment. Rare swelling.      Patient Active Problem List   Diagnosis Date Noted  . DM2 (diabetes mellitus, type 2) 06/13/2013  . Severe obesity (BMI  >= 40) 06/13/2013   Past Medical History  Diagnosis Date  . Hyperlipidemia   . Obesity   . Diabetes mellitus without complication     type II  . Glaucoma     beginning of  . Sleep apnea     wears c pap  . Hypertension     slight   Past Surgical History  Procedure Laterality Date  . Tonsillectomy and adenoidectomy    . Wisdom tooth extraction     No Known Allergies Prior to Admission medications   Medication Sig Start Date End Date Taking? Authorizing Provider  aspirin 81 MG tablet Take 81 mg by mouth daily.   Yes Historical Provider, MD  atorvastatin (LIPITOR) 40 MG tablet TAKE 1 TABLET AT BEDTIME DAILY NEED OFFICE VISIT FOR FURTHER REFILLS 02/20/14  Yes Todd M McVeigh, PA  glipiZIDE (GLUCOTROL) 5 MG tablet Take 1 tablet (5 mg total) by mouth 2 (two) times daily. 02/21/14  Yes Todd M McVeigh, PA  glucose blood (FREESTYLE LITE) test strip Use as directed. PLEASE COME IN FOR DIABETES CHECK UP 02/05/14  Yes Mancel Bale, PA-C  glucose monitoring kit (FREESTYLE) monitoring kit Patient may pick any glucometer he prefers. Use as directed. 10/21/12  Yes Thao P Le, DO  Lancets (FREESTYLE) lancets Patient preference. Use as instructed 10/21/12  Yes Thao P Le, DO  lisinopril (PRINIVIL,ZESTRIL) 5 MG tablet Take 1 tablet (5 mg total) by  mouth daily. 02/20/14  Yes Merlinda Frederick McVeigh, PA  metFORMIN (GLUCOPHAGE) 1000 MG tablet TAKE 1 TABLET BY MOUTH TWICE A DAY WITH MEALS 05/02/14  Yes Wendie Agreste, MD  OVER THE COUNTER MEDICATION Fish Oil 140 mg taking everyday.   Yes Historical Provider, MD  OVER THE COUNTER MEDICATION at bedtime.   Yes Historical Provider, MD  vitamin A 8000 UNIT capsule Take 8,000 Units by mouth daily.   Yes Historical Provider, MD  vitamin C (ASCORBIC ACID) 500 MG tablet Take 500 mg by mouth daily.   Yes Historical Provider, MD  Brinzolamide-Brimonidine 1-0.2 % SUSP Apply 1 drop to eye 2 (two) times daily before a meal.    Historical Provider, MD   History   Social History    . Marital Status: Married    Spouse Name: N/A  . Number of Children: N/A  . Years of Education: N/A   Occupational History  . Not on file.   Social History Main Topics  . Smoking status: Former Smoker    Types: Cigars  . Smokeless tobacco: Current User    Types: Snuff  . Alcohol Use: 3.0 oz/week    5 Standard drinks or equivalent per week  . Drug Use: No  . Sexual Activity: Not on file   Other Topics Concern  . Not on file   Social History Narrative     Review of Systems  Constitutional: Negative for fever, chills, fatigue and unexpected weight change.  Eyes: Negative for visual disturbance.  Respiratory: Negative for cough, chest tightness and shortness of breath.   Cardiovascular: Negative for chest pain, palpitations and leg swelling.  Gastrointestinal: Positive for diarrhea. Negative for vomiting, abdominal pain and blood in stool.  Musculoskeletal: Positive for arthralgias.  Skin: Negative for rash.  Neurological: Negative for dizziness, light-headedness and headaches.       Objective:   Physical Exam  Constitutional: He is oriented to person, place, and time. He appears well-developed and well-nourished.  HENT:  Head: Normocephalic and atraumatic.  Eyes: Pupils are equal, round, and reactive to light.  Cardiovascular: Normal rate, regular rhythm, normal heart sounds and intact distal pulses.   Pulmonary/Chest: Effort normal and breath sounds normal.  Abdominal: Soft. There is no tenderness.  Musculoskeletal:       Right knee: Normal.       Left knee: He exhibits no LCL laxity, no bony tenderness, normal meniscus and no MCL laxity. No tenderness found. No medial joint line, no lateral joint line, no MCL, no LCL and no patellar tendon tenderness noted.  Crepitus in L knee, but from, nontender, negative patellar compression and mcmurray testing.   Neurological: He is alert and oriented to person, place, and time.  Microfilament testing of feet normal  bilaterally.  Skin: Skin is warm, dry and intact. No rash noted.  Psychiatric: He has a normal mood and affect. His behavior is normal.  Vitals reviewed.  Filed Vitals:   05/29/14 1300  BP: 136/86  Pulse: 94  Temp: 98.4 F (36.9 C)  TempSrc: Oral  Resp: 18  Height: 5' 10.5" (1.791 m)  Weight: 339 lb 12.8 oz (154.132 kg)  SpO2: 95%   [facility] reading (PRIMARY) by  Dr. Carlota Raspberry: L knee minimal degenerative changes - including in patellofemoral joint.   Results for orders placed or performed in visit on 05/29/14  POCT glucose (manual entry)  Result Value Ref Range   POC Glucose 85 70 - 99 mg/dl  POCT glycosylated hemoglobin (Hb A1C)  Result Value Ref Range   Hemoglobin A1C 7.0         Assessment & Plan:   Jay [name] is a 59 y.o. male Knee pain, acute, left - Myalgia - Plan: CKPlan: DG Knee Complete 4 Views Left, meloxicam (MOBIC) 7.5 MG tablet  -possible patellofemoral pain by history, but DDX of degenerative/OA knee.  -trial of tylenol, ibuprofen otc or short term mobic (CV risks discussed), handout on PFPS.   -consider ortho eval if not improving.    -will check CK with distal quad pain, and statin use.   Diabetes type 2, controlled - Plan: [facility] Diabetes Foot Exam, POCT glucose (manual entry), POCT glycosylated hemoglobin (Hb A1C), COMPLETE METABOLIC PANEL WITH GFR, Lipid panel, glipiZIDE (GLUCOTROL) 5 MG tablet, metFORMIN (GLUCOPHAGE) 1000 MG tablet, DISCONTINUED: glipiZIDE (GLUCOTROL) 5 MG tablet  -stable. Controlled.   Diarrhea  - may be intake related with reports of alternating constipation and hard stool followed by diarrhea. - Recommended increased/consistent fiber intake, increase water intake.   -consider trial of lower dose of metformin if persistent, or alternately GI eval. rtc if worsening.    Hyperlipidemia - Plan: atorvastatin (LIPITOR) 40 MG tablet  -fasting. Labs obtained. Refilled lipitor 55m.   Essential hypertension - Plan: lisinopril (PRINIVIL,ZESTRIL) 5  MG tablet  -stable. Cont same dose lisinopril.   Meds ordered this encounter  Medications  . DISCONTD: glipiZIDE (GLUCOTROL) 5 MG tablet    Sig: Take 1 tablet (5 mg total) by mouth 2 (two) times daily.    Dispense:  60 tablet    Refill:  5  . meloxicam (MOBIC) 7.5 MG tablet    Sig: Take 1 tablet (7.5 mg total) by mouth daily as needed for pain.    Dispense:  30 tablet    Refill:  0  . atorvastatin (LIPITOR) 40 MG tablet    Sig: TAKE 1 TABLET AT BEDTIME DAILY    Dispense:  90 tablet    Refill:  1  . glipiZIDE (GLUCOTROL) 5 MG tablet    Sig: Take 1 tablet (5 mg total) by mouth 2 (two) times daily.    Dispense:  180 tablet    Refill:  1  . lisinopril (PRINIVIL,ZESTRIL) 5 MG tablet    Sig: Take 1 tablet (5 mg total) by mouth daily.    Dispense:  90 tablet    Refill:  1  . metFORMIN (GLUCOPHAGE) 1000 MG tablet    Sig: Take 1 tablet (1,000 mg total) by mouth 2 (two) times daily with a meal.    Dispense:  180 tablet    Refill:  1   Patient Instructions  Start tylenol for your knee, if this does not help - try the mobic each morning (do not combine with other over the counter pain relievers),  I will check a muscle enzyme test, but suspect this will be normal. If pain persists, I can refer you to an orthopaedist for possible injections.  I included information on possible patellofemoral syndrome below as well.   For diarrhea - increase fiber (through whole grain and vegetables in diet, or can add Citrucel) and water in the diet. If this is not helping in next week, consider decreased metformin dose. See other information below. Return to the clinic or go to the nearest emergency room if any of your symptoms worsen or new symptoms occur.  No change in diabetes meds for now.   Diarrhea Diarrhea is frequent loose and watery bowel movements. It can cause you to feel  weak and dehydrated. Dehydration can cause you to become tired and thirsty, have a dry mouth, and have decreased urination  that often is dark yellow. Diarrhea is a sign of another problem, most often an infection that will not last long. In most cases, diarrhea typically lasts 2-3 days. However, it can last longer if it is a sign of something more serious. It is important to treat your diarrhea as directed by your caregiver to lessen or prevent future episodes of diarrhea. CAUSES  Some common causes include:  Gastrointestinal infections caused by viruses, bacteria, or parasites.  Food poisoning or food allergies.  Certain medicines, such as antibiotics, chemotherapy, and laxatives.  Artificial sweeteners and fructose.  Digestive disorders. HOME CARE INSTRUCTIONS  Ensure adequate fluid intake (hydration): Have 1 cup (8 oz) of fluid for each diarrhea episode. Avoid fluids that contain simple sugars or sports drinks, fruit juices, whole milk products, and sodas. Your urine should be clear or pale yellow if you are drinking enough fluids. Hydrate with an oral rehydration solution that you can purchase at pharmacies, retail stores, and online. You can prepare an oral rehydration solution at home by mixing the following ingredients together:   - tsp table salt.   tsp baking soda.   tsp salt substitute containing potassium chloride.  1  tablespoons sugar.  1 L (34 oz) of water.  Certain foods and beverages may increase the speed at which food moves through the gastrointestinal (GI) tract. These foods and beverages should be avoided and include:  Caffeinated and alcoholic beverages.  High-fiber foods, such as raw fruits and vegetables, nuts, seeds, and whole grain breads and cereals.  Foods and beverages sweetened with sugar alcohols, such as xylitol, sorbitol, and mannitol.  Some foods may be well tolerated and may help thicken stool including:  Starchy foods, such as rice, toast, pasta, low-sugar cereal, oatmeal, grits, baked potatoes, crackers, and bagels.  Bananas.  Applesauce.  Add probiotic-rich  foods to help increase healthy bacteria in the GI tract, such as yogurt and fermented milk products.  Wash your hands well after each diarrhea episode.  Only take over-the-counter or prescription medicines as directed by your caregiver.  Take a warm bath to relieve any burning or pain from frequent diarrhea episodes. SEEK IMMEDIATE MEDICAL CARE IF:   You are unable to keep fluids down.  You have persistent vomiting.  You have blood in your stool, or your stools are black and tarry.  You do not urinate in 6-8 hours, or there is only a small amount of very dark urine.  You have abdominal pain that increases or localizes.  You have weakness, dizziness, confusion, or light-headedness.  You have a severe headache.  Your diarrhea gets worse or does not get better.  You have a fever or persistent symptoms for more than 2-3 days.  You have a fever and your symptoms suddenly get worse. MAKE SURE YOU:   Understand these instructions.  Will watch your condition.  Will get help right away if you are not doing well or get worse. Document Released: 02/07/2002 Document Revised: 07/04/2013 Document Reviewed: 10/26/2011 [facility] Patient Information 2015 [place], [place]. This information is not intended to replace advice given to you by your health care provider. Make sure you discuss any questions you have with your health care provider.   Knee Pain The knee is the complex joint between your thigh and your lower leg. It is made up of bones, tendons, ligaments, and cartilage. The bones that make  up the knee are:  The femur in the thigh.  The tibia and fibula in the lower leg.  The patella or kneecap riding in the groove on the lower femur. CAUSES  Knee pain is a common complaint with many causes. A few of these causes are:  Injury, such as:  A ruptured ligament or tendon injury.  Torn cartilage.  Medical conditions, such as:  Gout  Arthritis  Infections  Overuse, over  training, or overdoing a physical activity. Knee pain can be minor or severe. Knee pain can accompany debilitating injury. Minor knee problems often respond well to self-care measures or get well on their own. More serious injuries may need medical intervention or even surgery. SYMPTOMS The knee is complex. Symptoms of knee problems can vary widely. Some of the problems are:  Pain with movement and weight bearing.  Swelling and tenderness.  Buckling of the knee.  Inability to straighten or extend your knee.  Your knee locks and you cannot straighten it.  Warmth and redness with pain and fever.  Deformity or dislocation of the kneecap. DIAGNOSIS  Determining what is wrong may be very straight forward such as when there is an injury. It can also be challenging because of the complexity of the knee. Tests to make a diagnosis may include:  Your caregiver taking a history and doing a physical exam.  Routine X-rays can be used to rule out other problems. X-rays will not reveal a cartilage tear. Some injuries of the knee can be diagnosed by:  Arthroscopy a surgical technique by which a small video camera is inserted through tiny incisions on the sides of the knee. This procedure is used to examine and repair internal knee joint problems. Tiny instruments can be used during arthroscopy to repair the torn knee cartilage (meniscus).  Arthrography is a radiology technique. A contrast liquid is directly injected into the knee joint. Internal structures of the knee joint then become visible on X-ray film.  An MRI scan is a non X-ray radiology procedure in which magnetic fields and a computer produce two- or three-dimensional images of the inside of the knee. Cartilage tears are often visible using an MRI scanner. MRI scans have largely replaced arthrography in diagnosing cartilage tears of the knee.  Blood work.  Examination of the fluid that helps to lubricate the knee joint (synovial fluid).  This is done by taking a sample out using a needle and a syringe. TREATMENT The treatment of knee problems depends on the cause. Some of these treatments are:  Depending on the injury, proper casting, splinting, surgery, or physical therapy care will be needed.  Give yourself adequate recovery time. Do not overuse your joints. If you begin to get sore during workout routines, back off. Slow down or do fewer repetitions.  For repetitive activities such as cycling or running, maintain your strength and nutrition.  Alternate muscle groups. For example, if you are a weight lifter, work the upper body on one day and the lower body the next.  Either tight or weak muscles do not give the proper support for your knee. Tight or weak muscles do not absorb the stress placed on the knee joint. Keep the muscles surrounding the knee strong.  Take care of mechanical problems.  If you have flat feet, orthotics or special shoes may help. See your caregiver if you need help.  Arch supports, sometimes with wedges on the inner or outer aspect of the heel, can help. These can shift pressure  away from the side of the knee most bothered by osteoarthritis.  A brace called an "unloader" brace also may be used to help ease the pressure on the most arthritic side of the knee.  If your caregiver has prescribed crutches, braces, wraps or ice, use as directed. The acronym for this is PRICE. This means protection, rest, ice, compression, and elevation.  Nonsteroidal anti-inflammatory drugs (NSAIDs), can help relieve pain. But if taken immediately after an injury, they may actually increase swelling. Take NSAIDs with food in your stomach. Stop them if you develop stomach problems. Do not take these if you have a history of ulcers, stomach pain, or bleeding from the bowel. Do not take without your caregiver's approval if you have problems with fluid retention, heart failure, or kidney problems.  For ongoing knee problems,  physical therapy may be helpful.  Glucosamine and chondroitin are over-the-counter dietary supplements. Both may help relieve the pain of osteoarthritis in the knee. These medicines are different from the usual anti-inflammatory drugs. Glucosamine may decrease the rate of cartilage destruction.  Injections of a corticosteroid drug into your knee joint may help reduce the symptoms of an arthritis flare-up. They may provide pain relief that lasts a few months. You may have to wait a few months between injections. The injections do have a small increased risk of infection, water retention, and elevated blood sugar levels.  Hyaluronic acid injected into damaged joints may ease pain and provide lubrication. These injections may work by reducing inflammation. A series of shots may give relief for as long as 6 months.  Topical painkillers. Applying certain ointments to your skin may help relieve the pain and stiffness of osteoarthritis. Ask your pharmacist for suggestions. Many over the-counter products are approved for temporary relief of arthritis pain.  In some countries, doctors often prescribe topical NSAIDs for relief of chronic conditions such as arthritis and tendinitis. A review of treatment with NSAID creams found that they worked as well as oral medications but without the serious side effects. PREVENTION  Maintain a healthy weight. Extra pounds put more strain on your joints.  Get strong, stay limber. Weak muscles are a common cause of knee injuries. Stretching is important. Include flexibility exercises in your workouts.  Be smart about exercise. If you have osteoarthritis, chronic knee pain or recurring injuries, you may need to change the way you exercise. This does not mean you have to stop being active. If your knees ache after jogging or playing basketball, consider switching to swimming, water aerobics, or other low-impact activities, at least for a few days a week. Sometimes limiting  high-impact activities will provide relief.  Make sure your shoes fit well. Choose footwear that is right for your sport.  Protect your knees. Use the proper gear for knee-sensitive activities. Use kneepads when playing volleyball or laying carpet. Buckle your seat belt every time you drive. Most shattered kneecaps occur in car accidents.  Rest when you are tired. SEEK MEDICAL CARE IF:  You have knee pain that is continual and does not seem to be getting better.  SEEK IMMEDIATE MEDICAL CARE IF:  Your knee joint feels hot to the touch and you have a high fever. MAKE SURE YOU:   Understand these instructions.  Will watch your condition.  Will get help right away if you are not doing well or get worse. Document Released: 12/15/2006 Document Revised: 05/12/2011 Document Reviewed: 12/15/2006 [facility] Patient Information 2015 [place], [place]. This information is not intended to replace advice  given to you by your health care provider. Make sure you discuss any questions you have with your health care provider.   Patellofemoral Syndrome If you have had pain in the front of your knee for a long time, chances are good that you have patellofemoral syndrome. The word patella refers to the kneecap. Femoral (or femur) refers to the thigh bone. That is the bone the kneecap sits on. The kneecap is shaped like a triangle. Its job is to protect the knee and to improve the efficiency of your thigh muscles (quadriceps). The underside of the kneecap is made of smooth tissue (cartilage). This lets the kneecap slide up and down as the knee moves. Sometimes this cartilage becomes soft. Your healthcare provider may say the cartilage breaks down. That is patellofemoral syndrome. It can affect one knee, or both. The condition is sometimes called patellofemoral pain syndrome. That is because the condition is painful. The pain usually gets worse with activity. Sitting for a long time with the knee bent also makes the  pain worse. It usually gets better with rest and proper treatment. CAUSES  No one is sure why some people develop this problem and others do not. Runners often get it. One name for the condition is "runner's knee." However, some people run for years and never have knee pain. Certain things seem to make patellofemoral syndrome more likely. They include:  Moving out of alignment. The kneecap is supposed to move in a straight line when the thigh muscle pulls on it. Sometimes the kneecap moves in poor alignment. That can make the knee swell and hurt. Some experts believe it also wears down the cartilage.  Injury to the kneecap.  Strain on the knee. This may occur during sports activity. Soccer, running, skiing and cycling can put excess stress on the knee.  Being flat-footed or knock-kneed. SYMPTOMS   Knee pain.  Pain under the kneecap. This is usually a dull, aching pain.  Pain in the knee when doing certain things: squatting, kneeling, going up or down stairs.  Pain in the knee when you stand up after sitting down for awhile.  Tightness in the knee.  Loss of muscle strength in the thigh.  Swelling of the knee. DIAGNOSIS  Healthcare providers often send people with knee pain to an orthopedic caregiver. This person has special training to treat problems with bones and joints. To decide what is causing your knee pain, your caregiver will probably:  Do a physical exam. This will probably include:  Asking about symptoms you have noticed.  Asking about your activities and any injuries.  Feeling your knee. Moving it. This will help test the knee's strength. It will also check alignment (whether the knee and leg are aligned normally).  Order some tests, such as:  Imaging tests. They create pictures of the inside of the knee. Tests may include:  X-rays.  Computed tomography (CT) scan. This uses X-rays and a computer to show more detail.  Magnetic resonance imaging (MRI). This test  uses magnets, radio waves and a computer to make pictures. TREATMENT   Medication is almost always used first. It can relieve pain. It also can reduce swelling. Non-steroidal anti-inflammatory medicines (called NSAIDs) are usually suggested. Sometimes a stronger form is needed. A stronger form would require a prescription.  Other treatment may be needed after the swelling goes down. Possibilities include:  Exercise. Certain exercises can make the muscles around the knee stronger which decreases the pressure on the knee cap. This  includes the thigh muscle. Certain exercises also may be suggested to increase your flexibility.  A knee brace. This gives the knee extra support and helps align the movement of the knee cap.  Orthotics. These are special shoe inserts. They can help keep your leg and knee aligned.  Surgery is sometimes needed. This is rare. Options include:  Arthroscopy. The surgeon uses a special tool to remove any damaged pieces of the kneecap. Only a few small incisions (cuts) are needed.  Realignment. This is open surgery. The goals are to reduce pressure and fix the way the kneecap moves. HOME CARE INSTRUCTIONS   Take any medication prescribed by your healthcare provider. Follow the directions carefully.  If your knee is swollen:  Put ice or cold packs on it. Do this for 20 to 30 minutes, 3 to 4 times a day.  Keep the knee raised. Make sure it is supported. Put a pillow under it.  Rest your knee. For example, take the elevator instead of the stairs for awhile. Or, take a break from sports activity that strain your knee. Try walking or swimming instead.  Whenever you are active:  Use an elastic bandage on your knee. This gives it support.  After any activity, put ice or cold packs on your knees. Do this for about 10 to 20 minutes.  Make sure you wear shoes that give good support. Make sure they are not worn down. The heels should not slant in or out. SEEK MEDICAL CARE  IF:   Knee pain gets worse. Or it does not go away, even after taking pain medicine.  Swelling does not go down.  Your thigh muscle becomes weak.  You have an oral temperature above 102 F (38.9 C). SEEK IMMEDIATE MEDICAL CARE IF:  You have an oral temperature above 102 F (38.9 C), not controlled by medicine. Document Released: 02/05/2009 Document Revised: 05/12/2011 Document Reviewed: 05/09/2013 [facility] Patient Information 2015 [place], [place]. This information is not intended to replace advice given to you by your health care provider. Make sure you discuss any questions you have with your health care provider.

## 2014-08-02 ENCOUNTER — Other Ambulatory Visit: Payer: Self-pay | Admitting: Family Medicine

## 2014-08-21 ENCOUNTER — Ambulatory Visit (INDEPENDENT_AMBULATORY_CARE_PROVIDER_SITE_OTHER): Payer: Managed Care, Other (non HMO) | Admitting: Internal Medicine

## 2014-08-21 VITALS — BP 128/78 | HR 101 | Temp 98.0°F | Resp 17 | Ht 72.0 in | Wt 348.0 lb

## 2014-08-21 DIAGNOSIS — J45909 Unspecified asthma, uncomplicated: Secondary | ICD-10-CM | POA: Diagnosis not present

## 2014-08-21 DIAGNOSIS — M25562 Pain in left knee: Secondary | ICD-10-CM

## 2014-08-21 DIAGNOSIS — R05 Cough: Secondary | ICD-10-CM | POA: Diagnosis not present

## 2014-08-21 DIAGNOSIS — R059 Cough, unspecified: Secondary | ICD-10-CM

## 2014-08-21 DIAGNOSIS — J0100 Acute maxillary sinusitis, unspecified: Secondary | ICD-10-CM

## 2014-08-21 MED ORDER — AMOXICILLIN 500 MG PO CAPS: 1000.0000 mg | ORAL_CAPSULE | Freq: Two times a day (BID) | ORAL | Status: AC

## 2014-08-21 MED ORDER — MELOXICAM 7.5 MG PO TABS: 7.5000 mg | ORAL_TABLET | Freq: Every day | ORAL | Status: DC | PRN

## 2014-08-21 NOTE — Progress Notes (Addendum)
Subjective:  This chart was scribed for Tami Lin MD, by Tamsen Roers, at [facility].  This patient was seen in room 7 and the patient's care was started at 9:05 AM.    Patient ID: Jay [name], male    DOB: March 24, 1955, 59 y.o.   MRN: [id number] Chief Complaint  Patient presents with  . Sore Throat    feels tight and painful   . Nasal Congestion    started last thursday   . Cough    expelled   . Night Sweats  . Medication Refill    meloxicam    HPI  HPI Comments: Jay [name] is a 59 y.o. male who presents to the [facility] complaining of a sore throat,  productive cough/ postnasal drip and body aches onset four days ago.  He has associated symptoms of sweats/chills at night and is not able to sleep well or lay down as he feels like he will "choke" and has periods of "gurgling".   He has periods when he feels better and then feels worse again shortly after.  He has taken mucin- ex as well as Robitussin over the weekend for relief. Patient is unsure if he has been around any individuals who are also ill.  He denies any rashes or additional swelling that may be unusual. He has no other complaints today.    Patient Active Problem List   Diagnosis Date Noted  . DM2 (diabetes mellitus, type 2) 06/13/2013  . Severe obesity (BMI >= 40) 06/13/2013   Past Medical History  Diagnosis Date  . Hyperlipidemia   . Obesity   . Diabetes mellitus without complication     type II  . Glaucoma     beginning of  . Sleep apnea     wears c pap  . Hypertension     slight   Past Surgical History  Procedure Laterality Date  . Tonsillectomy and adenoidectomy    . Wisdom tooth extraction     No Known Allergies Prior to Admission medications   Medication Sig Start Date End Date Taking? Authorizing Provider  aspirin 81 MG tablet Take 81 mg by mouth daily.   Yes Historical Provider, MD  atorvastatin (LIPITOR) 40 MG tablet TAKE 1 TABLET AT  BEDTIME DAILY 05/29/14  Yes Wendie Agreste, MD  Brinzolamide-Brimonidine 1-0.2 % SUSP Apply 1 drop to eye 2 (two) times daily before a meal.   Yes Historical Provider, MD  glipiZIDE (GLUCOTROL) 5 MG tablet Take 1 tablet (5 mg total) by mouth 2 (two) times daily. 05/29/14  Yes Wendie Agreste, MD  glucose blood (FREESTYLE LITE) test strip Use as directed. PLEASE COME IN FOR DIABETES CHECK UP 02/05/14  Yes Mancel Bale, PA-C  glucose monitoring kit (FREESTYLE) monitoring kit Patient may pick any glucometer he prefers. Use as directed. 10/21/12  Yes Thao P Le, DO  Lancets (FREESTYLE) lancets Patient preference. Use as instructed 10/21/12  Yes Thao P Le, DO  lisinopril (PRINIVIL,ZESTRIL) 5 MG tablet Take 1 tablet (5 mg total) by mouth daily. 05/29/14  Yes Wendie Agreste, MD  meloxicam (MOBIC) 7.5 MG tablet Take 1 tablet (7.5 mg total) by mouth daily as needed for pain. 05/29/14  Yes Wendie Agreste, MD  metFORMIN (GLUCOPHAGE) 1000 MG tablet Take 1 tablet (1,000 mg total) by mouth 2 (two) times daily with a meal. 05/29/14  Yes Wendie Agreste, MD  OVER THE COUNTER MEDICATION Fish Oil 140 mg  taking everyday.   Yes Historical Provider, MD  OVER THE COUNTER MEDICATION at bedtime.   Yes Historical Provider, MD  vitamin A 8000 UNIT capsule Take 8,000 Units by mouth daily.   Yes Historical Provider, MD  vitamin C (ASCORBIC ACID) 500 MG tablet Take 500 mg by mouth daily.   Yes Historical Provider, MD   History   Social History  . Marital Status: Married    Spouse Name: N/A  . Number of Children: N/A  . Years of Education: N/A   Occupational History  . Not on file.   Social History Main Topics  . Smoking status: Former Smoker    Types: Cigars  . Smokeless tobacco: Current User    Types: Snuff  . Alcohol Use: 3.0 oz/week    5 Standard drinks or equivalent per week  . Drug Use: No  . Sexual Activity: Not on file   Other Topics Concern  . Not on file   Social History Narrative     Current  Outpatient Prescriptions on File Prior to Visit  Medication Sig Dispense Refill  . aspirin 81 MG tablet Take 81 mg by mouth daily.    Marland Kitchen atorvastatin (LIPITOR) 40 MG tablet TAKE 1 TABLET AT BEDTIME DAILY 90 tablet 1  . Brinzolamide-Brimonidine 1-0.2 % SUSP Apply 1 drop to eye 2 (two) times daily before a meal.    . glipiZIDE (GLUCOTROL) 5 MG tablet Take 1 tablet (5 mg total) by mouth 2 (two) times daily. 180 tablet 1  . glucose blood (FREESTYLE LITE) test strip Use as directed. PLEASE COME IN FOR DIABETES CHECK UP 100 each 0  . glucose monitoring kit (FREESTYLE) monitoring kit Patient may pick any glucometer he prefers. Use as directed. 1 each 0  . Lancets (FREESTYLE) lancets Patient preference. Use as instructed 100 each 12  . lisinopril (PRINIVIL,ZESTRIL) 5 MG tablet Take 1 tablet (5 mg total) by mouth daily. 90 tablet 1  . meloxicam (MOBIC) 7.5 MG tablet Take 1 tablet (7.5 mg total) by mouth daily as needed for pain. 30 tablet 0  . metFORMIN (GLUCOPHAGE) 1000 MG tablet Take 1 tablet (1,000 mg total) by mouth 2 (two) times daily with a meal. 180 tablet 1  . OVER THE COUNTER MEDICATION Fish Oil 140 mg taking everyday.    Marland Kitchen OVER THE COUNTER MEDICATION at bedtime.    . vitamin A 8000 UNIT capsule Take 8,000 Units by mouth daily.    . vitamin C (ASCORBIC ACID) 500 MG tablet Take 500 mg by mouth daily.     No current facility-administered medications on file prior to visit.    No Known Allergies    Review of Systems  Constitutional: Positive for chills.  HENT: Positive for congestion, postnasal drip and sore throat. Negative for dental problem and ear pain.   Eyes: Negative for pain, discharge and itching.  Respiratory: Positive for cough.   Cardiovascular: Negative for chest pain and leg swelling.  Gastrointestinal: Negative for nausea and vomiting.  Musculoskeletal: Positive for myalgias.       Objective:   Physical Exam  Constitutional: He is oriented to person, place, and time.  He appears well-developed and well-nourished. No distress.  Obese   HENT:  Head: Normocephalic and atraumatic.  Right TM is red  Throat is clear Nares are congested   Eyes: Conjunctivae and EOM are normal. Pupils are equal, round, and reactive to light.  Neck: Neck supple.  Cardiovascular: Normal rate, regular rhythm and normal heart sounds.  Pulmonary/Chest: Effort normal. No respiratory distress.  rhonchi scattered bilaterally with mild wheezing on forced expiration.   Musculoskeletal: Normal range of motion.  Pitting edema lower extremities with hyperpigmentation.   Lymphadenopathy:    He has no cervical adenopathy.  Neurological: He is alert and oriented to person, place, and time.  Skin: Skin is warm and dry.  Psychiatric: He has a normal mood and affect. His behavior is normal.  Nursing note and vitals reviewed.   Filed Vitals:   08/21/14 0850  BP: 128/78  Pulse: 101  Temp: 98 F (36.7 C)  TempSrc: Oral  Resp: 17  Height: 6' (1.829 m)  Weight: 348 lb (157.852 kg)  SpO2: 97%      Assessment & Plan:  I have completed the patient encounter in its entirety as documented by the scribe, with editing by me where necessary. Robert P. Laney Pastor, M.D.  Cough  Acute maxillary sinusitis, recurrence not specified  Reactive airway disease, unspecified asthma severity, uncomplicated  Knee pain, acute, left - Plan: meloxicam (MOBIC) 7.5 MG tablet  Meds ordered this encounter  Medications  . amoxicillin (AMOXIL) 500 MG capsule    Sig: Take 2 capsules (1,000 mg total) by mouth 2 (two) times daily.    Dispense:  40 capsule    Refill:  0  . meloxicam (MOBIC) 7.5 MG tablet    Sig: Take 1 tablet (7.5 mg total) by mouth daily as needed for pain.    Dispense:  30 tablet    Refill:  1    -  robit-dm  If develops more wheezing consider inhaler--he's not in favor at this point

## 2014-09-07 ENCOUNTER — Ambulatory Visit (INDEPENDENT_AMBULATORY_CARE_PROVIDER_SITE_OTHER): Payer: Managed Care, Other (non HMO) | Admitting: Physician Assistant

## 2014-09-07 VITALS — BP 150/80 | HR 90 | Temp 98.0°F | Resp 14 | Ht 72.0 in | Wt 360.0 lb

## 2014-09-07 DIAGNOSIS — R030 Elevated blood-pressure reading, without diagnosis of hypertension: Secondary | ICD-10-CM

## 2014-09-07 DIAGNOSIS — IMO0001 Reserved for inherently not codable concepts without codable children: Secondary | ICD-10-CM

## 2014-09-07 DIAGNOSIS — M722 Plantar fascial fibromatosis: Secondary | ICD-10-CM

## 2014-09-07 DIAGNOSIS — R058 Other specified cough: Secondary | ICD-10-CM

## 2014-09-07 DIAGNOSIS — R05 Cough: Secondary | ICD-10-CM

## 2014-09-07 MED ORDER — FLUTICASONE PROPIONATE 50 MCG/ACT NA SUSP: 2.0000 | Freq: Every day | NASAL | Status: DC

## 2014-09-07 MED ORDER — BENZONATATE 200 MG PO CAPS: 200.0000 mg | ORAL_CAPSULE | Freq: Two times a day (BID) | ORAL | Status: DC | PRN

## 2014-09-07 MED ORDER — CETIRIZINE HCL 10 MG PO TABS: 10.0000 mg | ORAL_TABLET | Freq: Every day | ORAL | Status: DC

## 2014-09-07 MED ORDER — NAPROXEN 500 MG PO TABS: 500.0000 mg | ORAL_TABLET | Freq: Two times a day (BID) | ORAL | Status: DC

## 2014-09-07 NOTE — Progress Notes (Signed)
09/07/2014 at 12:39 PM  Jay [name] / DOB: May 23, 1955 / MRN: [id number]  The patient has DM2 (diabetes mellitus, type 2) and Severe obesity (BMI >= 40) on his problem list.  SUBJECTIVE  Chief complaint: Sore Throat; Cough; and Foot Pain  Jay [name] is a 59 y.o. morbidly obese male here today for follow up of sore throat and cough that was diagnosed as acute maxillary sinusitis and reactive airway disease on 6/20.  He was prescribed Amox 1000 bid for 10 days and reports his symptoms have improved greatly since that time. He complains of lingering "tickle" in is throat that keeps causing him to cough. He denies fever, chills, nausea, facial pain, and dizziness today.    He stopped using smokeless tobacco products with his most recent illness described above and says that he is hungry all the time as a result.  Reports roughly 2 weeks of right sided plantar fascial pain that started after a day of vigorous yard work. He reports the pain is at its worse when he is getting out of bed in the morning, and the pain eases up once he takes a few steps.  This cycle repeats after he rest a while and then ambulates.  He reports it is making it difficult for him to exercise. He has a history of diabetes type 2 and recently tested negative for peripheral neuropathy.   He  has a past medical history of Hyperlipidemia; Obesity; Diabetes mellitus without complication; Glaucoma; Sleep apnea; and Hypertension.    Medications reviewed and updated by myself where necessary, and exist elsewhere in the encounter.   Jay [name] has No Known Allergies. He  reports that he has quit smoking. His smoking use included Cigars. His smokeless tobacco use includes Snuff. He reports that he drinks about 3.0 oz of alcohol per week. He reports that he does not use illicit drugs. He  has no sexual activity history on file. The patient  has past surgical history that includes Tonsillectomy and adenoidectomy and Wisdom tooth  extraction.  His family history includes Crohn's disease in his brother; Diabetes in his father. There is no history of Colon cancer, Esophageal cancer, Rectal cancer, or Stomach cancer.  Review of Systems  Constitutional: Negative for fever and chills.  Respiratory: Negative for cough.   Cardiovascular: Negative for chest pain.  Gastrointestinal: Negative for nausea.  Genitourinary: Negative for dysuria.  Musculoskeletal: Positive for myalgias.  Skin: Negative for itching and rash.  Neurological: Negative for dizziness and headaches.    OBJECTIVE  His  height is 6' (1.829 m) and weight is 360 lb (163.295 kg). His oral temperature is 98 F (36.7 C). His blood pressure is 150/80 and his pulse is 90. His respiration is 14 and oxygen saturation is 97%.  The patient's body mass index is 48.81 kg/(m^2).  Physical Exam  Vitals reviewed. Constitutional: He is oriented to person, place, and time. He appears well-developed and well-nourished. No distress.  HENT:  Mouth/Throat: Uvula is midline, oropharynx is clear and moist and mucous membranes are normal.    Cardiovascular: Normal rate and regular rhythm.   Respiratory: Effort normal and breath sounds normal. He has no decreased breath sounds. He has no wheezes. He has no rhonchi. He has no rales.  GI: Soft. Bowel sounds are normal.  Neurological: He is alert and oriented to person, place, and time. No cranial nerve deficit.  Skin: Skin is warm and dry. He is not diaphoretic.    No results found for  this or any previous visit (from the past 24 hour(s)).  ASSESSMENT & PLAN  [name] was seen today for sore throat, cough and foot pain.  Diagnoses and all orders for this visit:  Post-viral cough syndrome Orders: -     cetirizine (ZYRTEC) 10 MG tablet; Take 1 tablet (10 mg total) by mouth daily. -     fluticasone (FLONASE) 50 MCG/ACT nasal spray; Place 2 sprays into both nostrils daily. Take two sprays in each nostril daily. -      benzonatate (TESSALON) 200 MG capsule; Take 1 capsule (200 mg total) by mouth 2 (two) times daily as needed for cough.  Plantar fasciitis of right foot Orders: -     naproxen (NAPROSYN) 500 MG tablet; Take 1 tablet (500 mg total) by mouth 2 (two) times daily with a meal. Advised he hold the Meloxicam while taking the Naprosyn.  -     Ambulatory referral to Physical Therapy  Morbid obesity: He has gained 12 lbs since his last visit. Advised that he pursue water aerobics given problem 2.   Elevated BP: Most likely 2/2 3rd problem.  Advised patient to monitor this and maintain his current dose of Lisinopril.  He has a follow up with Dr. Carlota Raspberry in roughly two months.  Of note, this has been the first notable elevation of BP since he started Lisinopril 5 on 02/20/2014.      The patient was advised to call or come back to clinic if he does not see an improvement in symptoms, or worsens with the above plan.   Philis Fendt, MHS, PA-C [facility] 09/07/2014 12:39 PM

## 2014-09-14 ENCOUNTER — Telehealth: Payer: Self-pay

## 2014-09-14 NOTE — Telephone Encounter (Signed)
Spoke with pt, he was in last week and was diagnosed with plantar fascitis. He states he has a physical therapy appointment at 2:30 and they state he is supposed to have this (antophoresis procedure) at the appt so she can show him how to use the cream on his foot. Can we send this in?

## 2014-09-14 NOTE — Telephone Encounter (Signed)
Pt is going to see [facility] today due to Plantar fasciitis of right foot and needs a medication of texamethazon to take with him today

## 2014-09-14 NOTE — Telephone Encounter (Signed)
I think this is supposed to be dexamethasone.  I do not know the dosage they want and I will be willing to send in.  I have called but waited to long on the phone.  I am sorry he will not have this for his appointment but if you could call and get the specifics.

## 2014-09-15 NOTE — Telephone Encounter (Signed)
Spoke to pt and informed him of where we are at with situation.

## 2014-09-15 NOTE — Telephone Encounter (Signed)
Spoke to [employer]. They put a message in to the provider that the pt saw regarding dexamethasone rx. We are waiting to hear back from [facility] regarding rx dosage and instructions. They were not sure why the rx was not sent in from their office.

## 2014-09-18 NOTE — Telephone Encounter (Signed)
[facility] called. They said that if we called [facility] that they would know exactly what to prescribed. She said it was a patch and there may only be one dosage but she wasn't sure exactly. Just repeatedly said call [facility] to all of my questions.

## 2014-09-18 NOTE — Telephone Encounter (Signed)
[facility]. Please call pt and let him know, he should be getting a phone call from [facility] shortly and he can pick up the rx.

## 2014-09-18 NOTE — Telephone Encounter (Signed)
Left a detailed message letting pt know.

## 2014-10-12 ENCOUNTER — Ambulatory Visit (INDEPENDENT_AMBULATORY_CARE_PROVIDER_SITE_OTHER): Payer: Managed Care, Other (non HMO) | Admitting: Emergency Medicine

## 2014-10-12 ENCOUNTER — Ambulatory Visit (INDEPENDENT_AMBULATORY_CARE_PROVIDER_SITE_OTHER): Payer: Managed Care, Other (non HMO)

## 2014-10-12 VITALS — BP 140/70 | HR 107 | Temp 99.6°F | Resp 14 | Ht 72.0 in | Wt 360.0 lb

## 2014-10-12 DIAGNOSIS — M542 Cervicalgia: Secondary | ICD-10-CM | POA: Diagnosis not present

## 2014-10-12 DIAGNOSIS — S161XXA Strain of muscle, fascia and tendon at neck level, initial encounter: Secondary | ICD-10-CM

## 2014-10-12 DIAGNOSIS — S0990XA Unspecified injury of head, initial encounter: Secondary | ICD-10-CM | POA: Diagnosis not present

## 2014-10-12 DIAGNOSIS — Z23 Encounter for immunization: Secondary | ICD-10-CM | POA: Diagnosis not present

## 2014-10-12 DIAGNOSIS — M25512 Pain in left shoulder: Secondary | ICD-10-CM

## 2014-10-12 DIAGNOSIS — S0001XA Abrasion of scalp, initial encounter: Secondary | ICD-10-CM | POA: Diagnosis not present

## 2014-10-12 MED ORDER — CYCLOBENZAPRINE HCL 10 MG PO TABS: 10.0000 mg | ORAL_TABLET | Freq: Three times a day (TID) | ORAL | Status: DC | PRN

## 2014-10-12 MED ORDER — HYDROCODONE-ACETAMINOPHEN 5-325 MG PO TABS: 1.0000 | ORAL_TABLET | ORAL | Status: DC | PRN

## 2014-10-12 MED ORDER — NAPROXEN SODIUM 550 MG PO TABS: 550.0000 mg | ORAL_TABLET | Freq: Two times a day (BID) | ORAL | Status: DC

## 2014-10-12 NOTE — Progress Notes (Signed)
**Note DeZunigaIdentified via Obfuscation** Subjective:  Patient ID: Jay [name], [name]    DOB: 1955/12/11  Age: 59 y.o. MRN: [id number]  CC: Injury   HPI Jay [name] presents   With pain in his neck and Jay shoulder. He walked out to the mailbox after work to get his mail and after he walked back up on a porch he standing on the Jay [name] and a tree branch fell and struck him in the in the head and glanced off his head and struck him in the Jay shoulder. He is not really complaining much of headache or neck pain. He is bitterly complaining of pain in his Jay shoulder. He denies any radiation of pain numbness tingling or weakness. And he had no loss of consciousness has no neurologic or visual symptoms. The treatment with substantial probably 3[name]4 inches diameter he has taken no medication for his pain  History Jay [name] has a past medical history of Hyperlipidemia; Obesity; Diabetes mellitus without complication; Glaucoma; Sleep apnea; and Hypertension.   He has past surgical history that includes Tonsillectomy and adenoidectomy and Wisdom tooth extraction.   His  family history includes Crohn's disease in his brother; Diabetes in his father. There is no history of Colon cancer, Esophageal cancer, Rectal cancer, or Stomach cancer.  He   reports that he has quit smoking. His smoking use included Cigars. His smokeless tobacco use includes Snuff. He reports that he drinks about 3.0 oz of alcohol per week. He reports that he does not use illicit drugs.  Outpatient Prescriptions Prior to Visit  Medication Sig Dispense Refill  . aspirin 81 MG tablet Take 81 mg by mouth daily.    Marland Kitchen atorvastatin (LIPITOR) 40 MG tablet TAKE 1 TABLET AT BEDTIME DAILY 90 tablet 1  . BrinzolamideZunigaBrimonidine 1[name]0.2 % SUSP Apply 1 drop to eye 2 (two) times daily before a meal.    . cetirizine (ZYRTEC) 10 MG tablet Take 1 tablet (10 mg total) by mouth daily. 30 tablet 3  . fluticasone (FLONASE) 50 MCG/ACT nasal spray Place 2 sprays into both  nostrils daily. Take two sprays in each nostril daily. 16 g 12  . glipiZIDE (GLUCOTROL) 5 MG tablet Take 1 tablet (5 mg total) by mouth 2 (two) times daily. 180 tablet 1  . glucose blood (FREESTYLE LITE) test strip Use as directed. PLEASE COME IN FOR DIABETES CHECK UP 100 each 0  . glucose monitoring kit (FREESTYLE) monitoring kit Patient may pick any glucometer he prefers. Use as directed. 1 each 0  . Lancets (FREESTYLE) lancets Patient preference. Use as instructed 100 each 12  . lisinopril (PRINIVIL,ZESTRIL) 5 MG tablet Take 1 tablet (5 mg total) by mouth daily. 90 tablet 1  . meloxicam (MOBIC) 7.5 MG tablet Take 1 tablet (7.5 mg total) by mouth daily as needed for pain. 30 tablet 1  . metFORMIN (GLUCOPHAGE) 1000 MG tablet Take 1 tablet (1,000 mg total) by mouth 2 (two) times daily with a meal. 180 tablet 1  . naproxen (NAPROSYN) 500 MG tablet Take 1 tablet (500 mg total) by mouth 2 (two) times daily with a meal. 30 tablet 0  . OVER THE COUNTER MEDICATION Fish Oil 140 mg taking everyday.    . vitamin A 8000 UNIT capsule Take 8,000 Units by mouth daily.    . vitamin C (ASCORBIC ACID) 500 MG tablet Take 500 mg by mouth daily.    . benzonatate (TESSALON) 200 MG capsule Take 1 capsule (200 mg total) by mouth 2 (two) times daily  as needed for cough. (Patient not taking: Reported on 10/12/2014) 20 capsule 0  . OVER THE COUNTER MEDICATION at bedtime.     No facilityZunigaadministered medications prior to visit.    Social History   Social History  . Marital Status: Married    Spouse Name: N/A  . Number of Children: N/A  . Years of Education: N/A   Social History Main Topics  . Smoking status: Former Smoker    Types: Cigars  . Smokeless tobacco: Current User    Types: Snuff  . Alcohol Use: 3.0 oz/week    5 Standard drinks or equivalent per week  . Drug Use: No  . Sexual Activity: Not Asked   Other Topics Concern  . None   Social History Narrative     Review of Systems  Constitutional:  Negative for fever, chills and appetite change.  HENT: Negative for congestion, ear pain, postnasal drip, sinus pressure and sore throat.   Eyes: Negative for pain and redness.  Respiratory: Negative for cough, shortness of breath and wheezing.   Cardiovascular: Negative for leg swelling.  Gastrointestinal: Negative for nausea, vomiting, abdominal pain, diarrhea, constipation and blood in stool.  Endocrine: Negative for polyuria.  Genitourinary: Negative for dysuria, urgency, frequency and flank pain.  Musculoskeletal: Negative for gait problem.  Skin: Negative for rash.  Neurological: Negative for weakness and headaches.  Psychiatric/Behavioral: Negative for confusion and decreased concentration. The patient is not nervous/anxious.     Objective:  BP 140/70 mmHg  Pulse 107  Temp(Src) 99.6 F (37.6 C) (Oral)  Resp 14  Ht 6' (1.829 m)  Wt 360 lb (163.295 kg)  BMI 48.81 kg/m2  SpO2 95%  Physical Exam  Constitutional: He is oriented to person, place, and time. He appears wellZunigadeveloped and wellZuniganourished.  HENT:  Head: Atraumatic.  Eyes: Conjunctivae are normal. Pupils are equal, round, and reactive to light.  Pulmonary/Chest: Effort normal.  Musculoskeletal: He exhibits no edema.       Right shoulder: He exhibits decreased range of motion and tenderness.       Cervical back: He exhibits tenderness.  Neurological: He is alert and oriented to person, place, and time.  Skin: Skin is dry.  Psychiatric: He has a normal mood and affect. His behavior is normal. Thought content normal.      Assessment & Plan:   Jay [name] was seen today for injury.  Diagnoses and all orders for this visit:  Closed head injury, initial encounter  Pain in joint, shoulder region, Jay -     DG Shoulder Jay; Future  Cervical strain, initial encounter -     DG Cervical Spine Complete; Future  Abrasion of scalp, initial encounter  Other orders -     Tdap vaccine greater than or equal to 7yo  IM -     naproxen sodium (ANAPROX DS) 550 MG tablet; Take 1 tablet (550 mg total) by mouth 2 (two) times daily with a meal. -     cyclobenzaprine (FLEXERIL) 10 MG tablet; Take 1 tablet (10 mg total) by mouth 3 (three) times daily as needed for muscle spasms. -     HYDROcodoneZunigaacetaminophen (NORCO) 5[name]325 MG per tablet; Take 1[name]2 tablets by mouth every 4 (four) hours as needed.   I have discontinued Mr. Mehringer benzonatate. I am also having him start on naproxen sodium, cyclobenzaprine, and HYDROcodoneZunigaacetaminophen. Additionally, I am having him maintain his glucose monitoring kit, freestyle, OVER THE COUNTER MEDICATION, aspirin, glucose blood, vitamin C, vitamin A, BrinzolamideZunigaBrimonidine, atorvastatin, glipiZIDE, lisinopril,  metFORMIN, meloxicam, naproxen, cetirizine, and fluticasone.  Meds ordered this encounter  Medications  . naproxen sodium (ANAPROX DS) 550 MG tablet    Sig: Take 1 tablet (550 mg total) by mouth 2 (two) times daily with a meal.    Dispense:  40 tablet    Refill:  0  . cyclobenzaprine (FLEXERIL) 10 MG tablet    Sig: Take 1 tablet (10 mg total) by mouth 3 (three) times daily as needed for muscle spasms.    Dispense:  30 tablet    Refill:  0  . HYDROcodoneZunigaacetaminophen (NORCO) 5[name]325 MG per tablet    Sig: Take 1[name]2 tablets by mouth every 4 (four) hours as needed.    Dispense:  30 tablet    Refill:  0    Appropriate red flag conditions were discussed with the patient as well as actions that should be taken.  Patient expressed his understanding.  FollowZunigaup: No FollowZunigaup on file.  Roselee Culver, MD   [facility] reading (PRIMARY) by  Dr. Ouida Sills.  No osseous injury.  Loss cervical lordotic curve.

## 2014-10-12 NOTE — Patient Instructions (Signed)

## 2014-11-27 ENCOUNTER — Encounter: Payer: Self-pay | Admitting: Family Medicine

## 2014-11-27 ENCOUNTER — Ambulatory Visit (INDEPENDENT_AMBULATORY_CARE_PROVIDER_SITE_OTHER): Payer: Managed Care, Other (non HMO) | Admitting: Family Medicine

## 2014-11-27 VITALS — BP 142/86 | HR 100 | Temp 98.3°F | Resp 18 | Ht 71.5 in | Wt 346.6 lb

## 2014-11-27 DIAGNOSIS — Z1159 Encounter for screening for other viral diseases: Secondary | ICD-10-CM

## 2014-11-27 DIAGNOSIS — R7989 Other specified abnormal findings of blood chemistry: Secondary | ICD-10-CM

## 2014-11-27 DIAGNOSIS — I10 Essential (primary) hypertension: Secondary | ICD-10-CM | POA: Diagnosis not present

## 2014-11-27 DIAGNOSIS — E785 Hyperlipidemia, unspecified: Secondary | ICD-10-CM | POA: Diagnosis not present

## 2014-11-27 DIAGNOSIS — E119 Type 2 diabetes mellitus without complications: Secondary | ICD-10-CM

## 2014-11-27 DIAGNOSIS — Z114 Encounter for screening for human immunodeficiency virus [HIV]: Secondary | ICD-10-CM

## 2014-11-27 DIAGNOSIS — R945 Abnormal results of liver function studies: Secondary | ICD-10-CM

## 2014-11-27 DIAGNOSIS — E875 Hyperkalemia: Secondary | ICD-10-CM

## 2014-11-27 LAB — LIPID PANEL
CHOLESTEROL: 151 mg/dL (ref 125–200)
HDL: 36 mg/dL — ABNORMAL LOW (ref >=40)
LDL Cholesterol: 87 mg/dL (ref <130)
TRIGLYCERIDES: 142 mg/dL (ref <150)
Total CHOL/HDL Ratio: 4.2 Ratio (ref <=5.0)
VLDL: 28 mg/dL (ref <30)

## 2014-11-27 LAB — GLUCOSE, POCT (MANUAL RESULT ENTRY): POC Glucose: 149 mg/dl — AB (ref 70–99)

## 2014-11-27 LAB — COMPREHENSIVE METABOLIC PANEL
ALBUMIN: 4 g/dL (ref 3.6–5.1)
ALT: 53 U/L — ABNORMAL HIGH (ref 9–46)
AST: 31 U/L (ref 10–35)
Alkaline Phosphatase: 78 U/L (ref 40–115)
BILIRUBIN TOTAL: 0.5 mg/dL (ref 0.2–1.2)
BUN: 20 mg/dL (ref 7–25)
CALCIUM: 9.2 mg/dL (ref 8.6–10.3)
CO2: 27 mmol/L (ref 20–31)
Chloride: 106 mmol/L (ref 98–110)
Creat: 1.06 mg/dL (ref 0.70–1.33)
Glucose, Bld: 150 mg/dL — ABNORMAL HIGH (ref 65–99)
Potassium: 5.4 mmol/L — ABNORMAL HIGH (ref 3.5–5.3)
SODIUM: 139 mmol/L (ref 135–146)
TOTAL PROTEIN: 6.4 g/dL (ref 6.1–8.1)

## 2014-11-27 LAB — POCT GLYCOSYLATED HEMOGLOBIN (HGB A1C): Hemoglobin A1C: 7.4

## 2014-11-27 NOTE — Progress Notes (Addendum)
Subjective:    Patient ID: Jay [name], male    DOB: 03-31-1955, 59 y.o.   MRN: [id number] This chart was scribed for Jay Ray, MD by Zola Button, Medical Scribe. This patient was seen in Room 21 and the patient's care was started at 1:25 PM.   HPI HPI Comments: [name] is a 59 y.o. male with a history of DM type II who presents to the [facility] for a follow-up for diabetes. He had been dealing with plantar fasciitis previously for which he underwent physical therapy with relief.  Diabetes Type II: Currently takes aspirin 81 mg qd, Lipitor 40 mg qd, glipizide 5 mg BID, metformin 1000 mg BID, and lisinopril 5 mg qd. Optho 1 month ago, he is pre-glaucoma. Patient states he has not been exercising much. He did have a hypoglycemic episode about a month ago when he was at the beach; he had not eaten much that day and had been walking more than he anticipated, but this was improved after he ate dinner. Patient has not made any significant changes to his diet.  Lab Results  Component Value Date   HGBA1C 7.0 05/29/2014    Wt Readings from Last 3 Encounters:  11/27/14 346 lb 9.6 oz (157.217 kg)  10/12/14 360 lb (163.295 kg)  09/07/14 360 lb (163.295 kg)   Lab Results  Component Value Date   MICROALBUR 70.6* 02/20/2014    Hypertension: Takes lisinopril 5 mg qd and aspirin 81 mg qd for cardio protection. He had an elevated blood pressure of 150/80 at his July visit. He has not been checking his blood pressure at home.  Hyperlipidemia: Takes Lipitor 40 mg qd. Patient denies new myalgias or any other new side effects. Lab Results  Component Value Date   CHOL 147 05/29/2014   HDL 35* 05/29/2014   LDLCALC 80 05/29/2014   TRIG 162* 05/29/2014   CHOLHDL 4.2 05/29/2014   Lab Results  Component Value Date   ALT 48 05/29/2014   AST 25 05/29/2014   ALKPHOS 77 05/29/2014   BILITOT 0.4 05/29/2014     Immunizations: He refuses flu shot today.   Patient Active  Problem List   Diagnosis Date Noted  . DM2 (diabetes mellitus, type 2) 06/13/2013  . Severe obesity (BMI >= 40) 06/13/2013   Past Medical History  Diagnosis Date  . Hyperlipidemia   . Obesity   . Diabetes mellitus without complication     type II  . Glaucoma     beginning of  . Sleep apnea     wears c pap  . Hypertension     slight   Past Surgical History  Procedure Laterality Date  . Tonsillectomy and adenoidectomy    . Wisdom tooth extraction     No Known Allergies Prior to Admission medications   Medication Sig Start Date End Date Taking? Authorizing Provider  aspirin 81 MG tablet Take 81 mg by mouth daily.   Yes Historical Provider, MD  glipiZIDE (GLUCOTROL) 5 MG tablet Take 1 tablet (5 mg total) by mouth 2 (two) times daily. 05/29/14  Yes Wendie Agreste, MD  Lancets (FREESTYLE) lancets Patient preference. Use as instructed 10/21/12  Yes Thao P Le, DO  lisinopril (PRINIVIL,ZESTRIL) 5 MG tablet Take 1 tablet (5 mg total) by mouth daily. 05/29/14  Yes Wendie Agreste, MD  metFORMIN (GLUCOPHAGE) 1000 MG tablet Take 1 tablet (1,000 mg total) by mouth 2 (two) times daily with a meal. 05/29/14  Yes  Wendie Agreste, MD  naproxen (NAPROSYN) 500 MG tablet Take 1 tablet (500 mg total) by mouth 2 (two) times daily with a meal. 09/07/14  Yes Tereasa Coop, PA-C  naproxen sodium (ANAPROX DS) 550 MG tablet Take 1 tablet (550 mg total) by mouth 2 (two) times daily with a meal. 10/12/14 10/12/15 Yes Roselee Culver, MD  naproxen sodium (ANAPROX DS) 550 MG tablet Take 1 tablet (550 mg total) by mouth 2 (two) times daily with a meal. 10/12/14 10/12/15 Yes Roselee Culver, MD  atorvastatin (LIPITOR) 40 MG tablet TAKE 1 TABLET AT BEDTIME DAILY 05/29/14   Wendie Agreste, MD  Brinzolamide-Brimonidine 1-0.2 % SUSP Apply 1 drop to eye 2 (two) times daily before a meal.    Historical Provider, MD  cetirizine (ZYRTEC) 10 MG tablet Take 1 tablet (10 mg total) by mouth daily. Patient not taking:  Reported on 11/27/2014 09/07/14   Tereasa Coop, PA-C  cyclobenzaprine (FLEXERIL) 10 MG tablet Take 1 tablet (10 mg total) by mouth 3 (three) times daily as needed for muscle spasms. Patient not taking: Reported on 11/27/2014 10/12/14   Roselee Culver, MD  cyclobenzaprine (FLEXERIL) 10 MG tablet Take 1 tablet (10 mg total) by mouth 3 (three) times daily as needed for muscle spasms. Patient not taking: Reported on 11/27/2014 10/12/14   Roselee Culver, MD  fluticasone [facility]) 50 MCG/ACT nasal spray Place 2 sprays into both nostrils daily. Take two sprays in each nostril daily. Patient not taking: Reported on 11/27/2014 09/07/14   Tereasa Coop, PA-C  glucose blood (FREESTYLE LITE) test strip Use as directed. PLEASE COME IN FOR DIABETES CHECK UP 02/05/14   Mancel Bale, PA-C  glucose monitoring kit (FREESTYLE) monitoring kit Patient may pick any glucometer he prefers. Use as directed. 10/21/12   Thao P Le, DO  HYDROcodone-acetaminophen (NORCO) 5-325 MG per tablet Take 1-2 tablets by mouth every 4 (four) hours as needed. Patient not taking: Reported on 11/27/2014 10/12/14   Roselee Culver, MD  meloxicam (MOBIC) 7.5 MG tablet Take 1 tablet (7.5 mg total) by mouth daily as needed for pain. Patient not taking: Reported on 11/27/2014 08/21/14   Leandrew Koyanagi, MD  OVER THE COUNTER MEDICATION Fish Oil 140 mg taking everyday.    Historical Provider, MD  vitamin A 8000 UNIT capsule Take 8,000 Units by mouth daily.    Historical Provider, MD  vitamin C (ASCORBIC ACID) 500 MG tablet Take 500 mg by mouth daily.    Historical Provider, MD   Social History   Social History  . Marital Status: Married    Spouse Name: N/A  . Number of Children: N/A  . Years of Education: N/A   Occupational History  . Not on file.   Social History Main Topics  . Smoking status: Former Smoker    Types: Cigars  . Smokeless tobacco: Current User    Types: Snuff  . Alcohol Use: 3.0 oz/week    5 Standard drinks or  equivalent per week  . Drug Use: No  . Sexual Activity: Not on file   Other Topics Concern  . Not on file   Social History Narrative     Review of Systems  Constitutional: Negative for fatigue and unexpected weight change.  Eyes: Negative for visual disturbance.  Respiratory: Negative for cough, chest tightness and shortness of breath.   Cardiovascular: Negative for chest pain, palpitations and leg swelling.  Gastrointestinal: Negative for abdominal pain and blood in  stool.  Musculoskeletal: Negative for myalgias.  Neurological: Negative for dizziness, light-headedness and headaches.       Objective:   Physical Exam  Constitutional: He is oriented to person, place, and time. He appears well-developed and well-nourished. No distress.  HENT:  Head: Normocephalic and atraumatic.  Mouth/Throat: Oropharynx is clear and moist. No oropharyngeal exudate.  Eyes: EOM are normal. Pupils are equal, round, and reactive to light.  Neck: Neck supple. No JVD present. Carotid bruit is not present.  Cardiovascular: Normal rate, regular rhythm and normal heart sounds.   No murmur heard. Pulmonary/Chest: Effort normal and breath sounds normal. He has no rales.  Musculoskeletal: He exhibits edema.  Trace pedal edema.  Neurological: He is alert and oriented to person, place, and time. No cranial nerve deficit.  Skin: Skin is warm and dry. No rash noted.  Psychiatric: He has a normal mood and affect. His behavior is normal.  Nursing note and vitals reviewed.     Filed Vitals:   11/27/14 1308 11/27/14 1322  BP: 152/82 142/86  Pulse: 100   Temp: 98.3 F (36.8 C)   TempSrc: Oral   Resp: 18   Height: 5' 11.5" (1.816 m)   Weight: 346 lb 9.6 oz (157.217 kg)     Results for orders placed or performed in visit on 11/27/14  POCT glucose (manual entry)  Result Value Ref Range   POC Glucose 149 (A) 70 - 99 mg/dl  POCT glycosylated hemoglobin (Hb A1C)  Result Value Ref Range   Hemoglobin A1C  7.4          Assessment & Plan:   Davine Coba is a 59 y.o. male Type 2 diabetes mellitus without complication - Plan: POCT glucose (manual entry), POCT glycosylated hemoglobin (Hb A1C)  -Overall stable, except slightly above goal of 7.0. Discussed med changes, but decided to stay at same doses as he plans on working on diet changes and exercise for weight loss, and recheck in 3 months to determine if medication changes needed.  Screening for HIV (human immunodeficiency virus) - Plan: HIV antibody   Need for hepatitis C screening test - Plan: Hepatitis C antibody  Essential hypertension - Plan: Comprehensive metabolic panel  -Borderline. Check home blood pressure readings, and if remaining over 140/90, increase his lisinopril to 10 mg total dose per day. If he does end up doing this, advised to call me so I can send in the 10 mg prescription.  Hyperlipidemia - Plan: Comprehensive metabolic panel, Lipid panel  -Lipid panel pending. Continue same dose of Lipitor at present.  Meds ordered this encounter  Medications  . DORZOLAMIDE HCL-TIMOLOL MAL OP    Sig: Apply to eye 2 (two) times daily.   Patient Instructions  Keep a record of your blood pressures outside of the office and bring them to the next office visit. If they are running over 140/90 - can take (2) of the lisinopril each day  (10 mg total dose). Let me know if you do this and I can send in that higher dose.   Diabetes not quite at control - can remain at same doses of meds if you can get weight down and adjust diet. Recheck levels in 3 months to determine if med change needed.   Walking or other form of light to moderate intensity exercise at least 150 minutes per week to help with weight loss.   Portion control and healthy diet as discussed to help with weight loss.   Follow up in  3 months. Return to the clinic or go to the nearest emergency room if any of your symptoms worsen or new symptoms occur.  You should receive  a call or letter about your lab results within the next week to 10 days.           By signing my name below, I, Zola Button, attest that this documentation has been prepared under the direction and in the presence of Jay Ray, MD.  Electronically Signed: Zola Button, Medical Scribe. 11/27/2014. 1:43 PM.

## 2014-11-27 NOTE — Patient Instructions (Addendum)
Keep a record of your blood pressures outside of the office and bring them to the next office visit. If they are running over 140/90 - can take (2) of the lisinopril each day  (10 mg total dose). Let me know if you do this and I can send in that higher dose.   Diabetes not quite at control - can remain at same doses of meds if you can get weight down and adjust diet. Recheck levels in 3 months to determine if med change needed.   Walking or other form of light to moderate intensity exercise at least 150 minutes per week to help with weight loss.   Portion control and healthy diet as discussed to help with weight loss.   Follow up in 3 months. Return to the clinic or go to the nearest emergency room if any of your symptoms worsen or new symptoms occur.  You should receive a call or letter about your lab results within the next week to 10 days.

## 2014-11-28 ENCOUNTER — Other Ambulatory Visit: Payer: Self-pay | Admitting: Family Medicine

## 2014-11-28 LAB — HIV ANTIBODY (ROUTINE TESTING W REFLEX): HIV: NONREACTIVE

## 2014-11-28 LAB — HEPATITIS C ANTIBODY: HCV AB: NEGATIVE

## 2014-12-04 ENCOUNTER — Other Ambulatory Visit: Payer: Self-pay | Admitting: Family Medicine

## 2014-12-09 NOTE — Addendum Note (Signed)
Addended by: [name] R on: 12/09/2014 05:48 PM   Modules accepted: Orders

## 2014-12-25 ENCOUNTER — Encounter: Payer: Self-pay | Admitting: Family Medicine

## 2015-01-01 ENCOUNTER — Other Ambulatory Visit: Payer: Self-pay | Admitting: Physician Assistant

## 2015-01-07 ENCOUNTER — Other Ambulatory Visit: Payer: Self-pay | Admitting: Family Medicine

## 2015-02-16 ENCOUNTER — Other Ambulatory Visit: Payer: Self-pay | Admitting: Physician Assistant

## 2015-02-19 ENCOUNTER — Encounter: Payer: Self-pay | Admitting: Family Medicine

## 2015-02-19 ENCOUNTER — Ambulatory Visit (INDEPENDENT_AMBULATORY_CARE_PROVIDER_SITE_OTHER): Payer: Managed Care, Other (non HMO) | Admitting: Family Medicine

## 2015-02-19 VITALS — BP 110/70 | HR 73 | Temp 97.9°F | Resp 16 | Ht 70.75 in | Wt 345.8 lb

## 2015-02-19 DIAGNOSIS — E119 Type 2 diabetes mellitus without complications: Secondary | ICD-10-CM

## 2015-02-19 DIAGNOSIS — E875 Hyperkalemia: Secondary | ICD-10-CM | POA: Diagnosis not present

## 2015-02-19 DIAGNOSIS — IMO0001 Reserved for inherently not codable concepts without codable children: Secondary | ICD-10-CM

## 2015-02-19 DIAGNOSIS — H9193 Unspecified hearing loss, bilateral: Secondary | ICD-10-CM | POA: Diagnosis not present

## 2015-02-19 DIAGNOSIS — H9313 Tinnitus, bilateral: Secondary | ICD-10-CM | POA: Diagnosis not present

## 2015-02-19 DIAGNOSIS — E1165 Type 2 diabetes mellitus with hyperglycemia: Secondary | ICD-10-CM | POA: Diagnosis not present

## 2015-02-19 DIAGNOSIS — I10 Essential (primary) hypertension: Secondary | ICD-10-CM

## 2015-02-19 DIAGNOSIS — E669 Obesity, unspecified: Secondary | ICD-10-CM | POA: Diagnosis not present

## 2015-02-19 DIAGNOSIS — E871 Hypo-osmolality and hyponatremia: Secondary | ICD-10-CM

## 2015-02-19 DIAGNOSIS — E785 Hyperlipidemia, unspecified: Secondary | ICD-10-CM | POA: Diagnosis not present

## 2015-02-19 LAB — COMPLETE METABOLIC PANEL WITH GFR
ALBUMIN: 3.8 g/dL (ref 3.6–5.1)
ALK PHOS: 82 U/L (ref 40–115)
ALT: 59 U/L — AB (ref 9–46)
AST: 31 U/L (ref 10–35)
BILIRUBIN TOTAL: 0.5 mg/dL (ref 0.2–1.2)
BUN: 18 mg/dL (ref 7–25)
CALCIUM: 8.5 mg/dL — AB (ref 8.6–10.3)
CO2: 27 mmol/L (ref 20–31)
CREATININE: 0.97 mg/dL (ref 0.70–1.33)
Chloride: 98 mmol/L (ref 98–110)
GFR, Est African American: >89 mL/min (ref >=60)
GFR, Est Non African American: 86 mL/min (ref >=60)
Glucose, Bld: 141 mg/dL — ABNORMAL HIGH (ref 65–99)
POTASSIUM: 4.3 mmol/L (ref 3.5–5.3)
Sodium: 132 mmol/L — ABNORMAL LOW (ref 135–146)
TOTAL PROTEIN: 6.6 g/dL (ref 6.1–8.1)

## 2015-02-19 LAB — MICROALBUMIN, URINE: MICROALB UR: 28.3 mg/dL

## 2015-02-19 LAB — GLUCOSE, POCT (MANUAL RESULT ENTRY): POC Glucose: 162 mg/dl — AB (ref 70–99)

## 2015-02-19 LAB — POCT GLYCOSYLATED HEMOGLOBIN (HGB A1C): HEMOGLOBIN A1C: 7.7

## 2015-02-19 MED ORDER — ATORVASTATIN CALCIUM 40 MG PO TABS: ORAL_TABLET | ORAL | Status: DC

## 2015-02-19 MED ORDER — LISINOPRIL 5 MG PO TABS: ORAL_TABLET | ORAL | Status: DC

## 2015-02-19 MED ORDER — GLIPIZIDE 5 MG PO TABS: 7.5000 mg | ORAL_TABLET | Freq: Two times a day (BID) | ORAL | Status: DC

## 2015-02-19 MED ORDER — METFORMIN HCL 1000 MG PO TABS: 1000.0000 mg | ORAL_TABLET | Freq: Two times a day (BID) | ORAL | Status: DC

## 2015-02-19 NOTE — Progress Notes (Addendum)
Subjective:  This chart was scribed for Jay Ray, MD by Moises Blood, Medical Scribe. This patient was seen in room 25 and the patient's care was started 8:23 AM.   Patient ID: Jay [name], male    DOB: 1955-03-28, 59 y.o.   MRN: [id number] Chief Complaint  Patient presents with  . Follow-up    3 mos  . Diabetes    "haven't been good at checking blood sugars at home, once and awhile'.  . Medication Refill   HPI Jay [name] is a 59 y.o. male DM Follow up for DM, continued on same medication dosage with plan on weight loss and diet changes  He's been up and down with the weight. He denies complications with his medication. He was exercising more until the holidays hit. He notes that he loss about 6 lbs but gained it back.   Lab Results  Component Value Date   HGBA1C 7.4 11/27/2014   Wt Readings from Last 3 Encounters:  02/19/15 345 lb 12.8 oz (156.854 kg)  11/27/14 346 lb 9.6 oz (157.217 kg)  10/12/14 360 lb (163.295 kg)   Body mass index is 48.57 kg/(m^2).   Lab Results  Component Value Date   MICROALBUR 70.6* 02/20/2014   HTN Borderline last visit, option of increasing lisinopril was discussed.   He states that he's still on the same dosage.   HLD Lab Results  Component Value Date   CHOL 151 11/27/2014   HDL 36* 11/27/2014   LDLCALC 87 11/27/2014   TRIG 142 11/27/2014   CHOLHDL 4.2 11/27/2014   Lab Results  Component Value Date   ALT 53* 11/27/2014   AST 31 11/27/2014   ALKPHOS 78 11/27/2014   BILITOT 0.5 11/27/2014   Hyperkalemia Borderline last visit. Will recheck today.   Tinnitus He states he's noticed intermittent tinnitus his whole life with worsening in the last 5-6 months with some decreased hearing recently. He denies seeing ENT doctor recently. He informs fighting a cold right now. He denies fever.   Immunizations He was recommended a flu shot today but he declined.   Vision He's seen his eye doctor within this year.   Patient  Active Problem List   Diagnosis Date Noted  . DM2 (diabetes mellitus, type 2) ([place]) 06/13/2013  . Severe obesity (BMI >= 40) ([place]) 06/13/2013   Past Medical History  Diagnosis Date  . Hyperlipidemia   . Obesity   . Diabetes mellitus without complication     type II  . Glaucoma     beginning of  . Sleep apnea     wears c pap  . Hypertension     slight   Past Surgical History  Procedure Laterality Date  . Tonsillectomy and adenoidectomy    . Wisdom tooth extraction     No Known Allergies Prior to Admission medications   Medication Sig Start Date End Date Taking? Authorizing Provider  aspirin 81 MG tablet Take 81 mg by mouth daily.   Yes Historical Provider, MD  atorvastatin (LIPITOR) 40 MG tablet TAKE 1 TABLET BY MOUTH AT BEDTIME (OV NEEDED FOR ADDITIONAL REFILLS) 01/02/15  Yes Wendie Agreste, MD  DORZOLAMIDE HCL-TIMOLOL MAL OP Apply to eye 2 (two) times daily.   Yes Historical Provider, MD  glipiZIDE (GLUCOTROL) 5 MG tablet TAKE 1 TABLET BY MOUTH TWICE A DAY 02/18/15  Yes Mancel Bale, PA-C  lisinopril (PRINIVIL,ZESTRIL) 5 MG tablet TAKE 1 TABLET BY MOUTH EVERY DAY 01/07/15  Yes Wendie Agreste, MD  metFORMIN (GLUCOPHAGE) 1000 MG tablet Take 1 tablet (1,000 mg total) by mouth 2 (two) times daily with a meal. 05/29/14  Yes Wendie Agreste, MD  OVER THE COUNTER MEDICATION Fish Oil 140 mg taking everyday.   Yes Historical Provider, MD  vitamin A 8000 UNIT capsule Take 8,000 Units by mouth daily.   Yes Historical Provider, MD  vitamin C (ASCORBIC ACID) 500 MG tablet Take 500 mg by mouth daily.   Yes Historical Provider, MD  Brinzolamide-Brimonidine 1-0.2 % SUSP Apply 1 drop to eye 2 (two) times daily before a meal. Reported on 02/19/2015    Historical Provider, MD  fluticasone (FLONASE) 50 MCG/ACT nasal spray Place 2 sprays into both nostrils daily. Take two sprays in each nostril daily. Patient not taking: Reported on 11/27/2014 09/07/14   Tereasa Coop, PA-C  glucose blood  (FREESTYLE LITE) test strip Use as directed. PLEASE COME IN FOR DIABETES CHECK UP Patient not taking: Reported on 02/19/2015 02/05/14   Mancel Bale, PA-C  glucose monitoring kit (FREESTYLE) monitoring kit Patient may pick any glucometer he prefers. Use as directed. Patient not taking: Reported on 02/19/2015 10/21/12   Thao P Le, DO  HYDROcodone-acetaminophen (NORCO) 5-325 MG per tablet Take 1-2 tablets by mouth every 4 (four) hours as needed. Patient not taking: Reported on 11/27/2014 10/12/14   Roselee Culver, MD  Lancets (FREESTYLE) lancets Patient preference. Use as instructed Patient not taking: Reported on 02/19/2015 10/21/12   Glenford Bayley, DO   Social History   Social History  . Marital Status: Married    Spouse Name: N/A  . Number of Children: N/A  . Years of Education: N/A   Occupational History  . Not on file.   Social History Main Topics  . Smoking status: Former Smoker    Types: Cigars  . Smokeless tobacco: Current User    Types: Snuff  . Alcohol Use: 3.0 oz/week    5 Standard drinks or equivalent per week  . Drug Use: No  . Sexual Activity: Not on file   Other Topics Concern  . Not on file   Social History Narrative   Review of Systems  Constitutional: Negative for fever, fatigue and unexpected weight change.  HENT: Positive for tinnitus.   Eyes: Negative for visual disturbance.  Respiratory: Negative for cough, chest tightness and shortness of breath.   Cardiovascular: Negative for chest pain, palpitations and leg swelling.  Gastrointestinal: Negative for abdominal pain and blood in stool.  Neurological: Negative for dizziness, light-headedness and headaches.       Objective:   Physical Exam  Constitutional: He is oriented to person, place, and time. He appears well-developed and well-nourished.  HENT:  Head: Normocephalic and atraumatic.  Right Ear: Tympanic membrane normal.  Left Ear: Tympanic membrane normal.  Right canal: minimal obstructive  cerumen Left canal: some cerumen, non obstructive  Eyes: EOM are normal. Pupils are equal, round, and reactive to light.  Neck: No JVD present. Carotid bruit is not present.  Cardiovascular: Normal rate, regular rhythm and normal heart sounds.   No murmur heard. Pulmonary/Chest: Effort normal and breath sounds normal. He has no rales.  Musculoskeletal: He exhibits no edema.  1+ pedal edema with stasis changes  Neurological: He is alert and oriented to person, place, and time.  Skin: Skin is warm and dry.  Psychiatric: He has a normal mood and affect.  Vitals reviewed.   Filed Vitals:   02/19/15 0802  BP: 110/70  Pulse: 73  Temp: 97.9 F (36.6 C)  TempSrc: Oral  Resp: 16  Height: 5' 10.75" (1.797 m)  Weight: 345 lb 12.8 oz (156.854 kg)  SpO2: 97%   Results for orders placed or performed in visit on 02/19/15  POCT glycosylated hemoglobin (Hb A1C)  Result Value Ref Range   Hemoglobin A1C 7.7   POCT glucose (manual entry)  Result Value Ref Range   POC Glucose 162 (A) 70 - 99 mg/dl       Assessment & Plan:   Ceejay Kegley is a 59 y.o. male Uncontrolled type 2 diabetes mellitus without complication, without long-term current use of insulin ([place]) - Plan: POCT glycosylated hemoglobin (Hb A1C), POCT glucose (manual entry), Microalbumin, urine  - still same weight as last visit.  (lost then gained back).   -increase glipizide to 1.5 tablets (7.45m) BID AC, cautioned on hypoglycemia sx's - return to prior dose if these occur. Cont same dose metformin.   Essential hypertension - Plan: lisinopril (PRINIVIL,ZESTRIL) 5 MG tablet  Stable, no med changes.   Hyperkalemia - Plan: COMPLETE METABOLIC PANEL WITH GFR  Tinnitus, bilateral - Plan: Ambulatory referral to ENT hearing decreased, bilateral - Plan: Ambulatory referral to ENT  Longstanding but now more persistent tinnitus and hearing loss - eval by ENT.   Obesity  - counseled on morbid/severe nature based on his BMI. Offered  referral to nutritionist or bariatric surgeon, declined at present.   Hyperlipidemia - Plan: atorvastatin (LIPITOR) 40 MG tablet, COMPLETE METABOLIC PANEL WITH GFR  -stable prior. No med changes.   Recheck in 3 months.   Meds ordered this encounter  Medications  . atorvastatin (LIPITOR) 40 MG tablet    Sig: TAKE 1 TABLET BY MOUTH AT BEDTIME    Dispense:  90 tablet    Refill:  1  . lisinopril (PRINIVIL,ZESTRIL) 5 MG tablet    Sig: TAKE 1 TABLET BY MOUTH EVERY DAY    Dispense:  90 tablet    Refill:  1  . glipiZIDE (GLUCOTROL) 5 MG tablet    Sig: Take 1.5 tablets (7.5 mg total) by mouth 2 (two) times daily before a meal.    Dispense:  270 tablet    Refill:  0  . metFORMIN (GLUCOPHAGE) 1000 MG tablet    Sig: Take 1 tablet (1,000 mg total) by mouth 2 (two) times daily with a meal.    Dispense:  180 tablet    Refill:  1   Patient Instructions  Work on diet, portion control and exercise again as you had been improving, but as we discussed, let me know if you would like to meet with bariatric surgeon to look into other options.   No change in cholesterol or blood pressure meds for now.   I will refer you to ENT for the ringing in your ears and trouble hearing.   I will call you about the diabetes control and plan.   Return to the clinic or go to the nearest emergency room if any of your symptoms worsen or new symptoms occur.  Follow up in 3 months.   Tinnitus Tinnitus refers to hearing a sound when there is no actual source for that sound. This is often described as ringing in the ears. However, people with this condition may hear a variety of noises. A person may hear the sound in one ear or in both ears.  The sounds of tinnitus can be soft, loud, or somewhere in between. Tinnitus can last for a few seconds or can  be constant for days. It may go away without treatment and come back at various times. When tinnitus is constant or happens often, it can lead to other problems, such as  trouble sleeping and trouble concentrating. Almost everyone experiences tinnitus at some point. Tinnitus that is long-lasting (chronic) or comes back often is a problem that may require medical attention.  CAUSES  The cause of tinnitus is often not known. In some cases, it can result from other problems or conditions, including:   Exposure to loud noises from machinery, music, or other sources.  Hearing loss.  Ear or sinus infections.  Earwax buildup.  A foreign object in the ear.  Use of certain medicines.  Use of alcohol and caffeine.  High blood pressure.  Heart diseases.  Anemia.  Allergies.  Meniere disease.  Thyroid problems.  Tumors.  An enlarged part of a weakened blood vessel (aneurysm). SYMPTOMS The main symptom of tinnitus is hearing a sound when there is no source for that sound. It may sound like:   Buzzing.  Roaring.  Ringing.  Blowing air, similar to the sound heard when you listen to a seashell.  Hissing.  Whistling.  Sizzling.  Humming.  Running water.  A sustained musical note. DIAGNOSIS  Tinnitus is diagnosed based on your symptoms. Your health care provider will do a physical exam. A comprehensive hearing exam (audiologic exam) will be done if your tinnitus:   Affects only one ear (unilateral).  Causes hearing difficulties.  Lasts 6 months or longer. You may also need to see a health care provider who specializes in hearing disorders (audiologist). You may be asked to complete a questionnaire to determine the severity of your tinnitus. Tests may be done to help determine the cause and to rule out other conditions. These can include:  Imaging studies of your head and brain, such as:  A CT scan.  An MRI.  An imaging study of your blood vessels (angiogram). TREATMENT  Treating an underlying medical condition can sometimes make tinnitus go away. If your tinnitus continues, other treatments may include:  Medicines, such as  certain antidepressants or sleeping aids.  Sound generators to mask the tinnitus. These include:  Tabletop sound machines that play relaxing sounds to help you fall asleep.  Wearable devices that fit in your ear and play sounds or music.  A small device that uses headphones to deliver a signal embedded in music (acoustic neural stimulation). In time, this may change the pathways of your brain and make you less sensitive to tinnitus. This device is used for very severe cases when no other treatment is working.  Therapy and counseling to help you manage the stress of living with tinnitus.  Using hearing aids or cochlear implants, if your tinnitus is related to hearing loss. HOME CARE INSTRUCTIONS  When possible, avoid being in loud places and being exposed to loud sounds.  Wear hearing protection, such as earplugs, when you are exposed to loud noises.  Do not take stimulants, such as nicotine, alcohol, or caffeine.  Practice techniques for reducing stress, such as meditation, yoga, or deep breathing.  Use a white noise machine, a humidifier, or other devices to mask the sound of tinnitus.  Sleep with your head slightly raised. This may reduce the impact of tinnitus.  Try to get plenty of rest each night. SEEK MEDICAL CARE IF:  You have tinnitus in just one ear.  Your tinnitus continues for 3 weeks or longer without stopping.  Home care measures  are not helping.  You have tinnitus after a head injury.  You have tinnitus along with any of the following:  Dizziness.  Loss of balance.  Nausea and vomiting.   This information is not intended to replace advice given to you by your health care provider. Make sure you discuss any questions you have with your health care provider.   Document Released: 02/17/2005 Document Revised: 03/10/2014 Document Reviewed: 07/20/2013 Elsevier Interactive Patient Education [employer].        By signing my name below, I,  Moises Blood, attest that this documentation has been prepared under the direction and in the presence of Jay Ray, MD. Electronically Signed: Moises Blood, [place]. 02/19/2015 , 8:23 AM .  I personally performed the services described in this documentation, which was scribed in my presence. The recorded information has been reviewed and considered, and addended by me as needed.

## 2015-02-19 NOTE — Patient Instructions (Signed)
Work on diet, portion control and exercise again as you had been improving, but as we discussed, let me know if you would like to meet with bariatric surgeon to look into other options.   No change in cholesterol or blood pressure meds for now.   I will refer you to ENT for the ringing in your ears and trouble hearing.   I will call you about the diabetes control and plan.   Return to the clinic or go to the nearest emergency room if any of your symptoms worsen or new symptoms occur.  Follow up in 3 months.   Tinnitus Tinnitus refers to hearing a sound when there is no actual source for that sound. This is often described as ringing in the ears. However, people with this condition may hear a variety of noises. A person may hear the sound in one ear or in both ears.  The sounds of tinnitus can be soft, loud, or somewhere in between. Tinnitus can last for a few seconds or can be constant for days. It may go away without treatment and come back at various times. When tinnitus is constant or happens often, it can lead to other problems, such as trouble sleeping and trouble concentrating. Almost everyone experiences tinnitus at some point. Tinnitus that is long-lasting (chronic) or comes back often is a problem that may require medical attention.  CAUSES  The cause of tinnitus is often not known. In some cases, it can result from other problems or conditions, including:   Exposure to loud noises from machinery, music, or other sources.  Hearing loss.  Ear or sinus infections.  Earwax buildup.  A foreign object in the ear.  Use of certain medicines.  Use of alcohol and caffeine.  High blood pressure.  Heart diseases.  Anemia.  Allergies.  Meniere disease.  Thyroid problems.  Tumors.  An enlarged part of a weakened blood vessel (aneurysm). SYMPTOMS The main symptom of tinnitus is hearing a sound when there is no source for that sound. It may sound like:    Buzzing.  Roaring.  Ringing.  Blowing air, similar to the sound heard when you listen to a seashell.  Hissing.  Whistling.  Sizzling.  Humming.  Running water.  A sustained musical note. DIAGNOSIS  Tinnitus is diagnosed based on your symptoms. Your health care provider will do a physical exam. A comprehensive hearing exam (audiologic exam) will be done if your tinnitus:   Affects only one ear (unilateral).  Causes hearing difficulties.  Lasts 6 months or longer. You may also need to see a health care provider who specializes in hearing disorders (audiologist). You may be asked to complete a questionnaire to determine the severity of your tinnitus. Tests may be done to help determine the cause and to rule out other conditions. These can include:  Imaging studies of your head and brain, such as:  A CT scan.  An MRI.  An imaging study of your blood vessels (angiogram). TREATMENT  Treating an underlying medical condition can sometimes make tinnitus go away. If your tinnitus continues, other treatments may include:  Medicines, such as certain antidepressants or sleeping aids.  Sound generators to mask the tinnitus. These include:  Tabletop sound machines that play relaxing sounds to help you fall asleep.  Wearable devices that fit in your ear and play sounds or music.  A small device that uses headphones to deliver a signal embedded in music (acoustic neural stimulation). In time, this may change the  pathways of your brain and make you less sensitive to tinnitus. This device is used for very severe cases when no other treatment is working.  Therapy and counseling to help you manage the stress of living with tinnitus.  Using hearing aids or cochlear implants, if your tinnitus is related to hearing loss. HOME CARE INSTRUCTIONS  When possible, avoid being in loud places and being exposed to loud sounds.  Wear hearing protection, such as earplugs, when you are  exposed to loud noises.  Do not take stimulants, such as nicotine, alcohol, or caffeine.  Practice techniques for reducing stress, such as meditation, yoga, or deep breathing.  Use a white noise machine, a humidifier, or other devices to mask the sound of tinnitus.  Sleep with your head slightly raised. This may reduce the impact of tinnitus.  Try to get plenty of rest each night. SEEK MEDICAL CARE IF:  You have tinnitus in just one ear.  Your tinnitus continues for 3 weeks or longer without stopping.  Home care measures are not helping.  You have tinnitus after a head injury.  You have tinnitus along with any of the following:  Dizziness.  Loss of balance.  Nausea and vomiting.   This information is not intended to replace advice given to you by your health care provider. Make sure you discuss any questions you have with your health care provider.   Document Released: 02/17/2005 Document Revised: 03/10/2014 Document Reviewed: 07/20/2013 Elsevier Interactive Patient Education [employer].

## 2015-02-20 ENCOUNTER — Encounter: Payer: Self-pay | Admitting: Family Medicine

## 2015-03-07 NOTE — Addendum Note (Signed)
Addended by: [name] R on: 03/07/2015 09:49 PM   Modules accepted: Orders

## 2015-03-14 ENCOUNTER — Other Ambulatory Visit: Payer: Self-pay | Admitting: Family Medicine

## 2015-04-12 ENCOUNTER — Other Ambulatory Visit: Payer: Self-pay | Admitting: Family Medicine

## 2015-05-27 ENCOUNTER — Other Ambulatory Visit: Payer: Self-pay | Admitting: Family Medicine

## 2015-05-28 ENCOUNTER — Ambulatory Visit: Payer: Managed Care, Other (non HMO) | Admitting: Family Medicine

## 2015-05-30 ENCOUNTER — Encounter: Payer: Self-pay | Admitting: Family Medicine

## 2015-05-30 ENCOUNTER — Ambulatory Visit (INDEPENDENT_AMBULATORY_CARE_PROVIDER_SITE_OTHER): Payer: Managed Care, Other (non HMO) | Admitting: Family Medicine

## 2015-05-30 VITALS — BP 130/80 | HR 83 | Temp 97.7°F | Resp 16 | Ht 71.0 in | Wt 344.6 lb

## 2015-05-30 DIAGNOSIS — E785 Hyperlipidemia, unspecified: Secondary | ICD-10-CM

## 2015-05-30 DIAGNOSIS — I10 Essential (primary) hypertension: Secondary | ICD-10-CM

## 2015-05-30 DIAGNOSIS — E1165 Type 2 diabetes mellitus with hyperglycemia: Secondary | ICD-10-CM | POA: Diagnosis not present

## 2015-05-30 DIAGNOSIS — E119 Type 2 diabetes mellitus without complications: Secondary | ICD-10-CM | POA: Diagnosis not present

## 2015-05-30 DIAGNOSIS — E871 Hypo-osmolality and hyponatremia: Secondary | ICD-10-CM | POA: Diagnosis not present

## 2015-05-30 DIAGNOSIS — IMO0001 Reserved for inherently not codable concepts without codable children: Secondary | ICD-10-CM

## 2015-05-30 LAB — LIPID PANEL
CHOL/HDL RATIO: 3.7 ratio (ref <=5.0)
CHOLESTEROL: 118 mg/dL — AB (ref 125–200)
HDL: 32 mg/dL — AB (ref >=40)
LDL Cholesterol: 55 mg/dL (ref <130)
TRIGLYCERIDES: 154 mg/dL — AB (ref <150)
VLDL: 31 mg/dL — ABNORMAL HIGH (ref <30)

## 2015-05-30 LAB — COMPLETE METABOLIC PANEL WITH GFR
ALBUMIN: 4.1 g/dL (ref 3.6–5.1)
ALK PHOS: 81 U/L (ref 40–115)
ALT: 65 U/L — ABNORMAL HIGH (ref 9–46)
AST: 42 U/L — ABNORMAL HIGH (ref 10–35)
BUN: 16 mg/dL (ref 7–25)
CALCIUM: 9 mg/dL (ref 8.6–10.3)
CO2: 22 mmol/L (ref 20–31)
Chloride: 103 mmol/L (ref 98–110)
Creat: 0.95 mg/dL (ref 0.70–1.33)
GFR, EST NON AFRICAN AMERICAN: 88 mL/min (ref >=60)
Glucose, Bld: 138 mg/dL — ABNORMAL HIGH (ref 65–99)
POTASSIUM: 4.6 mmol/L (ref 3.5–5.3)
SODIUM: 140 mmol/L (ref 135–146)
Total Bilirubin: 0.5 mg/dL (ref 0.2–1.2)
Total Protein: 6.8 g/dL (ref 6.1–8.1)

## 2015-05-30 LAB — GLUCOSE, POCT (MANUAL RESULT ENTRY): POC Glucose: 166 mg/dl — AB (ref 70–99)

## 2015-05-30 LAB — POCT GLYCOSYLATED HEMOGLOBIN (HGB A1C): Hemoglobin A1C: 7.4

## 2015-05-30 MED ORDER — LISINOPRIL 5 MG PO TABS: ORAL_TABLET | ORAL | Status: DC

## 2015-05-30 MED ORDER — GLIPIZIDE 5 MG PO TABS: ORAL_TABLET | ORAL | Status: AC

## 2015-05-30 MED ORDER — METFORMIN HCL 1000 MG PO TABS: 1000.0000 mg | ORAL_TABLET | Freq: Two times a day (BID) | ORAL | Status: DC

## 2015-05-30 MED ORDER — ATORVASTATIN CALCIUM 40 MG PO TABS: ORAL_TABLET | ORAL | Status: AC

## 2015-05-30 NOTE — Progress Notes (Addendum)
Subjective:    Patient ID: Jay [name], male    DOB: Dec 23, 1955, 60 y.o.   MRN: [id number] By signing my name below, I, Zola Button, attest that this documentation has been prepared under the direction and in the presence of [name], MD.  Electronically Signed: Zola Button, Medical Scribe. 05/30/2015. 8:29 AM.   HPI HPI Comments: Jay [name] is a 60 y.o. male who presents to the [facility] for a follow-up for diabetes and other concerns. Patient is fasting today.  Diabetes: He is on aspirin 81 mg qd, glipizide 5 mg 1.5 pills twice a day, metformin 500 mg BID, and lisinopril 5 mg qd. He has not been checking his blood sugars. He has not been exercising. Lab Results  Component Value Date   HGBA1C 7.7 02/19/2015    Wt Readings from Last 3 Encounters:  05/30/15 344 lb 9.6 oz (156.31 kg)  02/19/15 345 lb 12.8 oz (156.854 kg)  11/27/14 346 lb 9.6 oz (157.217 kg)   Body mass index is 48.08 kg/(m^2).  Lab Results  Component Value Date   MICROALBUR 28.3 02/19/2015    Hypertension: On lisinopril as above. Lab Results  Component Value Date   CREATININE 0.97 02/19/2015    Hyperlipidemia: He takes Lipitor 40 mg qd. He denies myalgias and other side effects. Lab Results  Component Value Date   CHOL 151 11/27/2014   HDL 36* 11/27/2014   LDLCALC 87 11/27/2014   TRIG 142 11/27/2014   CHOLHDL 4.2 11/27/2014   Lab Results  Component Value Date   ALT 59* 02/19/2015   AST 31 02/19/2015   ALKPHOS 82 02/19/2015   BILITOT 0.5 02/19/2015    Hyponatremia: Noted in December with sodium 132. Advised to return in 1 week for lab only visit. Order was placed January 4th, but he has not had this done. Patient denies recent hospitalizations.  Nail darkening, left great toe: Patient is also complaining of darkening at the base of his left great toe. He does not remember stubbing it.  Patient will be moving to [place] in the [place] area for a new job next  Monday with [employer].  Patient Active Problem List   Diagnosis Date Noted  . DM2 (diabetes mellitus, type 2) ([place]) 06/13/2013  . Severe obesity (BMI >= 40) ([place]) 06/13/2013   Past Medical History  Diagnosis Date  . Hyperlipidemia   . Obesity   . Diabetes mellitus without complication ([place])     type II  . Glaucoma     beginning of  . Sleep apnea     wears c pap  . Hypertension     slight   Past Surgical History  Procedure Laterality Date  . Tonsillectomy and adenoidectomy    . Wisdom tooth extraction     No Known Allergies Prior to Admission medications   Medication Sig Start Date End Date Taking? Authorizing Provider  aspirin 81 MG tablet Take 81 mg by mouth daily.    Historical Provider, MD  atorvastatin (LIPITOR) 40 MG tablet TAKE 1 TABLET BY MOUTH AT BEDTIME 02/19/15   Wendie Agreste, MD  Brinzolamide-Brimonidine 1-0.2 % SUSP Apply 1 drop to eye 2 (two) times daily before a meal. Reported on 02/19/2015    Historical Provider, MD  DORZOLAMIDE HCL-TIMOLOL MAL OP Apply to eye 2 (two) times daily.    Historical Provider, MD  glipiZIDE (GLUCOTROL) 5 MG tablet TAKE 1&1/2 TABLETS BY MOUTH TWICE A DAY BEFORE A MEAL 05/27/15  Wendie Agreste, MD  glucose blood (FREESTYLE LITE) test strip Use as directed. PLEASE COME IN FOR DIABETES CHECK UP Patient not taking: Reported on 02/19/2015 02/05/14   Mancel Bale, PA-C  glucose monitoring kit (FREESTYLE) monitoring kit Patient may pick any glucometer he prefers. Use as directed. Patient not taking: Reported on 02/19/2015 10/21/12   Thao P Le, DO  Lancets (FREESTYLE) lancets Patient preference. Use as instructed Patient not taking: Reported on 02/19/2015 10/21/12   Thao P Le, DO  lisinopril (PRINIVIL,ZESTRIL) 5 MG tablet TAKE 1 TABLET BY MOUTH EVERY DAY 02/19/15   Wendie Agreste, MD  metFORMIN (GLUCOPHAGE) 1000 MG tablet Take 1 tablet (1,000 mg total) by mouth 2 (two) times daily with a meal. 02/19/15   Wendie Agreste, MD  OVER  THE COUNTER MEDICATION Fish Oil 140 mg taking everyday.    Historical Provider, MD  vitamin A 8000 UNIT capsule Take 8,000 Units by mouth daily.    Historical Provider, MD  vitamin C (ASCORBIC ACID) 500 MG tablet Take 500 mg by mouth daily.    Historical Provider, MD   Social History   Social History  . Marital Status: Married    Spouse Name: N/A  . Number of Children: N/A  . Years of Education: N/A   Occupational History  . Not on file.   Social History Main Topics  . Smoking status: Former Smoker    Types: Cigars  . Smokeless tobacco: Current User    Types: Snuff  . Alcohol Use: 3.0 oz/week    5 Standard drinks or equivalent per week  . Drug Use: No  . Sexual Activity: Not on file   Other Topics Concern  . Not on file   Social History Narrative     Review of Systems  Musculoskeletal: Negative for myalgias.  Skin: Positive for color change.       Objective:   Physical Exam  Constitutional: He is oriented to person, place, and time. He appears well-developed and well-nourished.  HENT:  Head: Normocephalic and atraumatic.  Eyes: EOM are normal. Pupils are equal, round, and reactive to light.  Neck: No JVD present. Carotid bruit is not present.  Cardiovascular: Normal rate, regular rhythm and normal heart sounds.   No murmur heard. Pulmonary/Chest: Effort normal and breath sounds normal. He has no rales.  Musculoskeletal: He exhibits no edema.  Neurological: He is alert and oriented to person, place, and time.  Skin: Skin is warm and dry.  Base of left great toe, at the nail fold is a slightly darkened area. Non-tender. No surrounding erythema or swelling. Nail is unaffected.  Psychiatric: He has a normal mood and affect.  Vitals reviewed.   Filed Vitals:   05/30/15 0816  BP: 130/80  Pulse: 83  Temp: 97.7 F (36.5 C)  TempSrc: Oral  Resp: 16  Height: '5\' 11"'  (1.803 m)  Weight: 344 lb 9.6 oz (156.31 kg)  SpO2: 97%     Results for orders placed or  performed in visit on 05/30/15  POCT glucose (manual entry)  Result Value Ref Range   POC Glucose 166 (A) 70 - 99 mg/dl  POCT glycosylated hemoglobin (Hb A1C)  Result Value Ref Range   Hemoglobin A1C 7.4        Assessment & Plan:   Jay [name] is a 60 y.o. male Uncontrolled type 2 diabetes mellitus without complication, without long-term current use of insulin ([place]) - Plan: HM Diabetes Foot Exam, POCT glucose (manual entry), POCT  glycosylated hemoglobin (Hb A1C)  - option of adding med to metformin discussed, but plans on increasing exercise and diet efforts. Follow up in [place] until then if needed  Hyperlipidemia - Plan: Lipid panel, atorvastatin (LIPITOR) 40 MG tablet  - continue lipitor [id number].   Essential hypertension - Plan: lisinopril (PRINIVIL,ZESTRIL) 5 MG tablet  -stable. Cont same dose lisinopril. Labs pending. Ok   Hyponatremia - Plan: COMPLETE METABOLIC PANEL WITH GFR  -repeat CMP. Asymptomatic.    Dirt vs dried blood on proximal nail fold. Cleanse area and if persists/worsens - recheck.   Meds ordered this encounter  Medications  . atorvastatin (LIPITOR) 40 MG tablet    Sig: TAKE 1 TABLET BY MOUTH AT BEDTIME    Dispense:  90 tablet    Refill:  0  . glipiZIDE (GLUCOTROL) 5 MG tablet    Sig: TAKE 1&1/2 TABLETS BY MOUTH TWICE A DAY BEFORE A MEAL    Dispense:  270 tablet    Refill:  0  . lisinopril (PRINIVIL,ZESTRIL) 5 MG tablet    Sig: TAKE 1 TABLET BY MOUTH EVERY DAY    Dispense:  90 tablet    Refill:  0  . metFORMIN (GLUCOPHAGE) 1000 MG tablet    Sig: Take 1 tablet (1,000 mg total) by mouth 2 (two) times daily with a meal.    Dispense:  180 tablet    Refill:  0   Patient Instructions  Good luck with the move and [place]  Can keep med doses same for now, setup primary provider visit in next 3 months. Work on diet and exercise and discuss weight [place]  [place]       I personally performed the services described in this documentation, which was scribed in my presence. The recorded information has been reviewed and considered, and addended by me as needed.

## 2015-05-30 NOTE — Patient Instructions (Addendum)
Good luck with the move and let me know if your new provider in [place] needs any information.  Can keep med doses same for now, setup primary provider visit in next 3 months. Work on diet and exercise and discuss weight with your new provider in [place].

## 2015-07-19 IMAGING — CR DG KNEE COMPLETE 4+V*L*
3 series · 3 of 3 positions shown · non-contrast
Comparison: None.

CLINICAL DATA: Left knee pain. No reported history of injury.
Initial evaluation .

EXAM:
LEFT KNEE - COMPLETE 4+ VIEW

[AP]
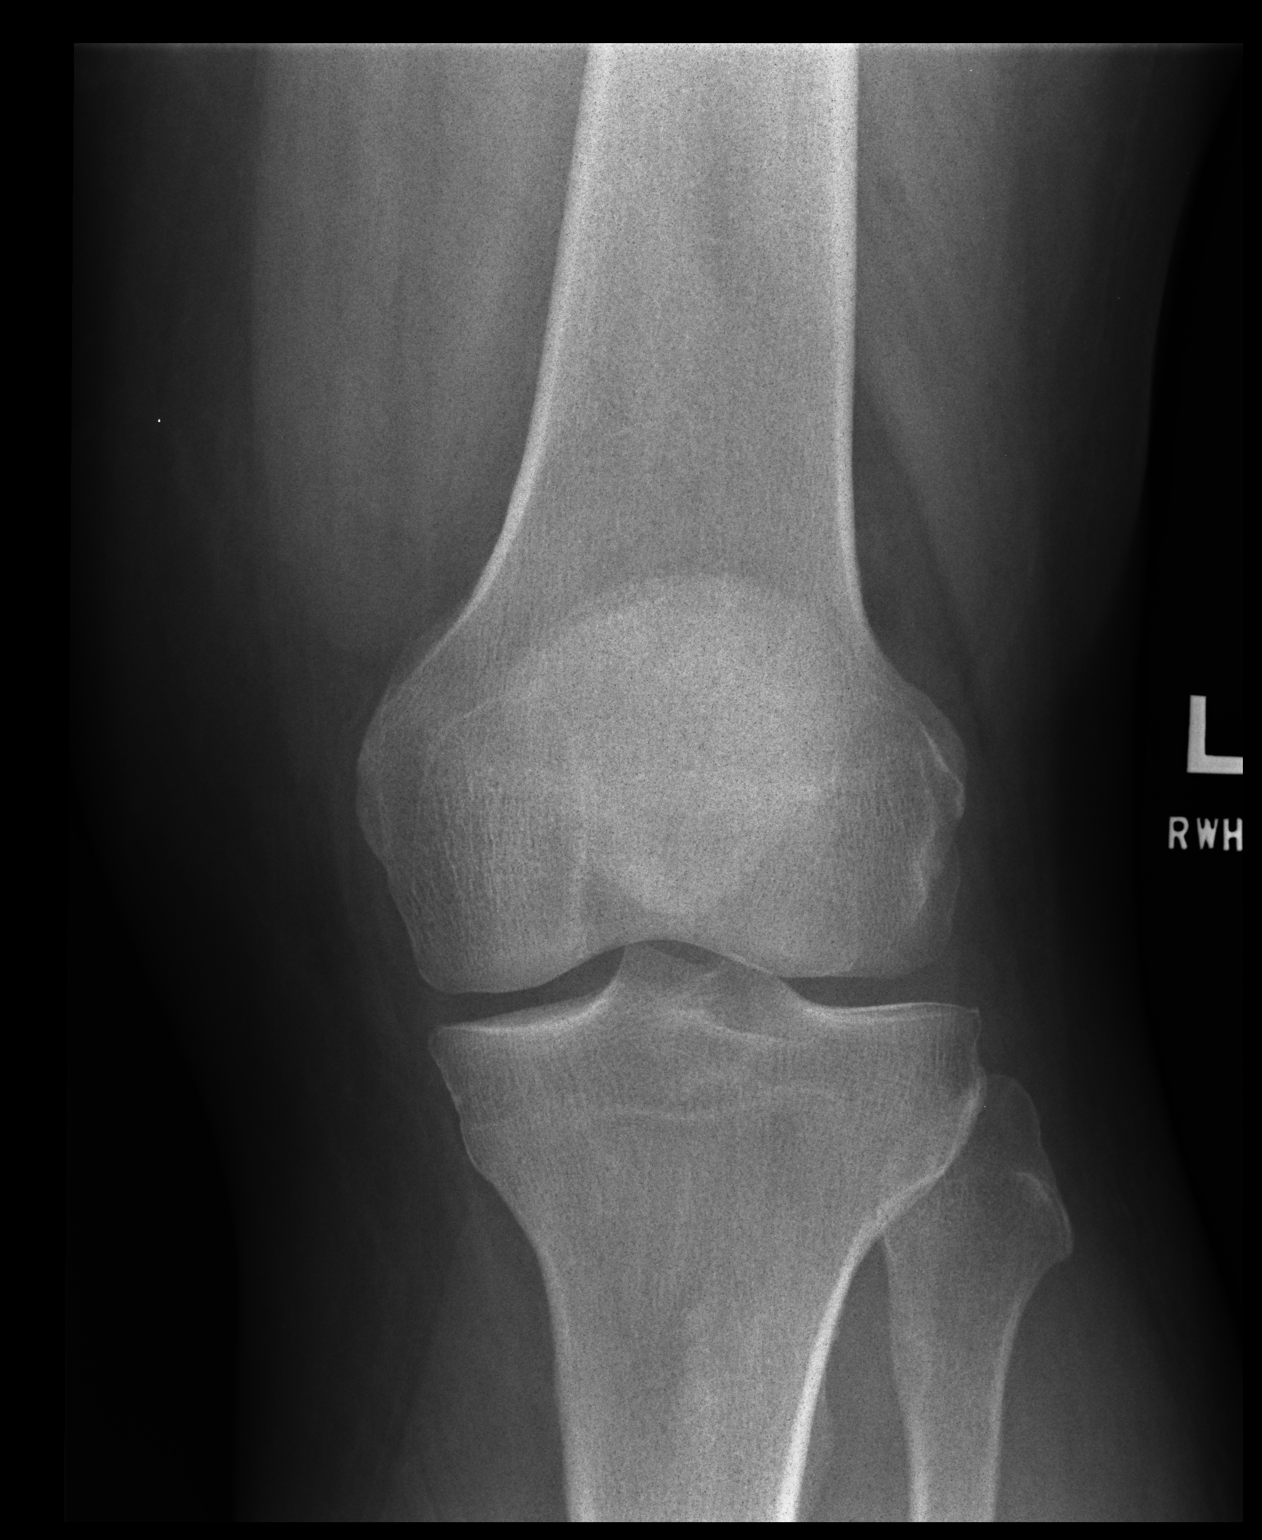

[other]
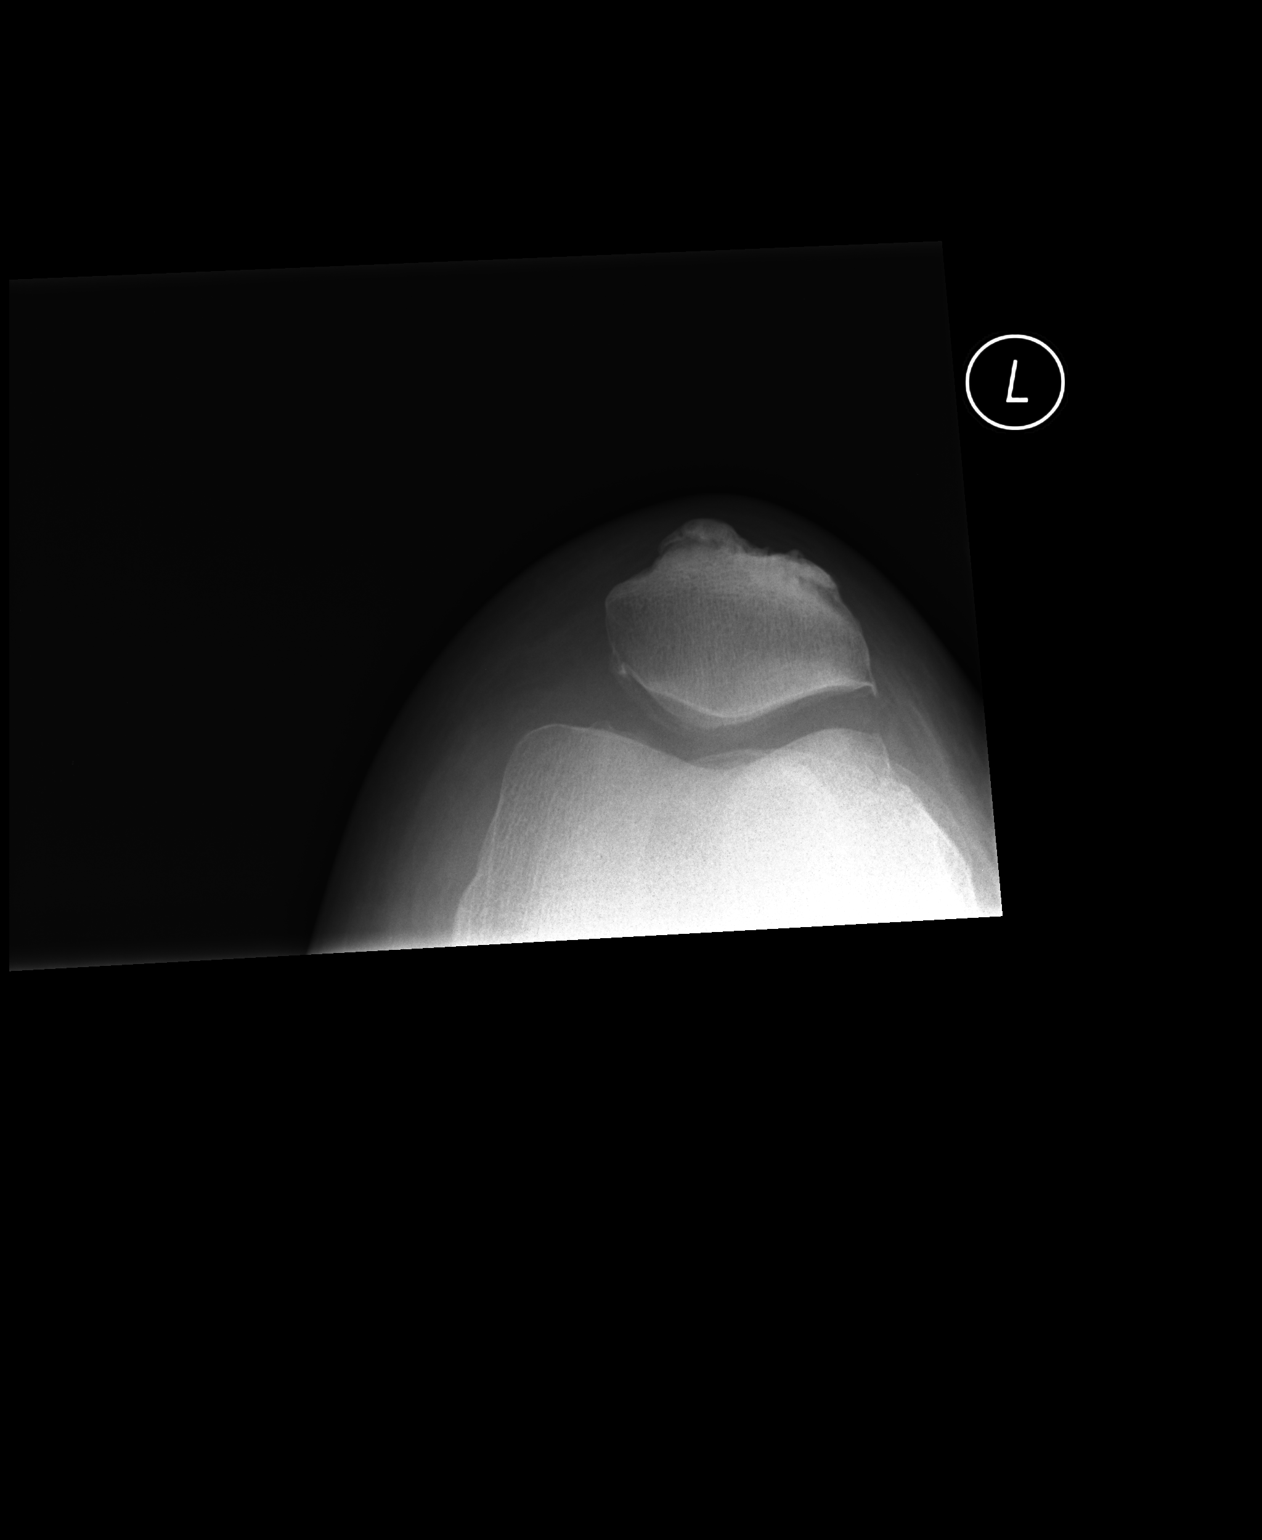

[lateral]
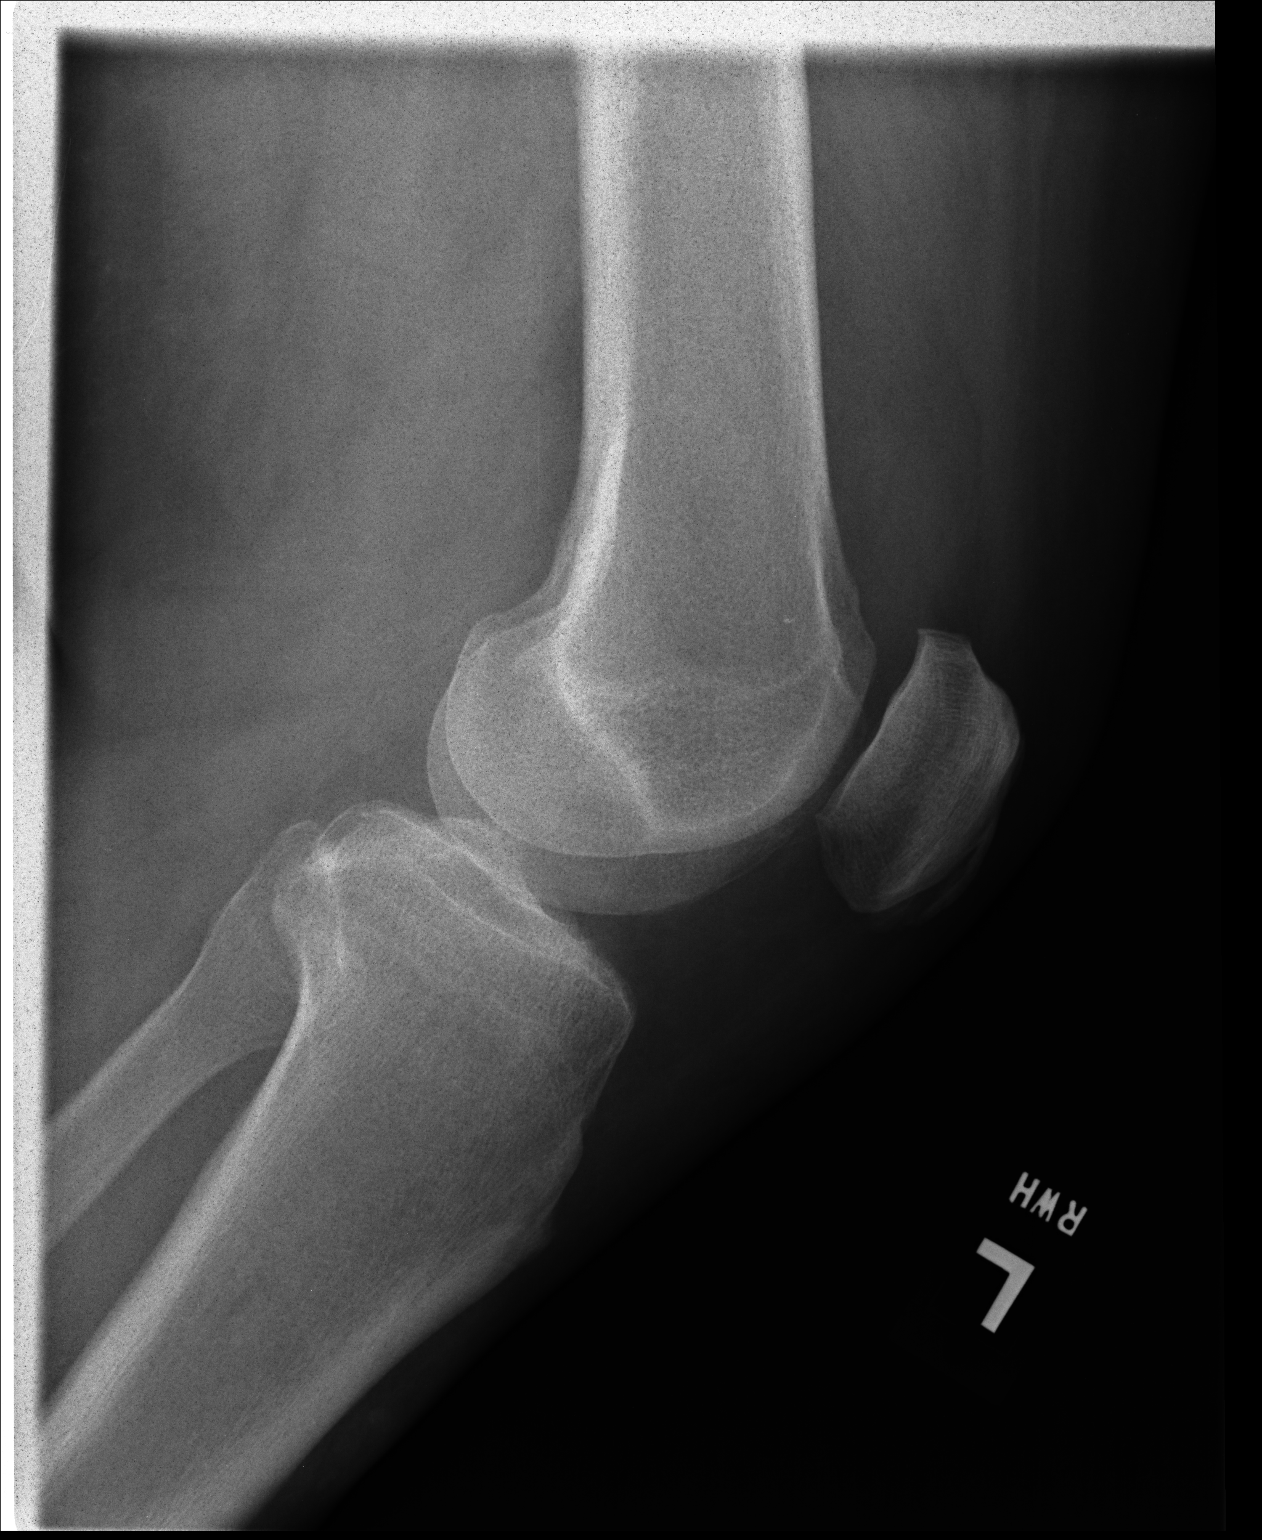

[3 of 3 positions shown; findings below may reference images not displayed]

FINDINGS: Mild patellofemoral degenerative change. No acute bony abnormality
identified. No evidence of fracture. Small knee joint effusion
cannot be excluded .
IMPRESSION: Mild patellofemoral degenerative change. Small knee joint effusion
cannot be completely excluded Otherwise negative exam.

## 2015-08-23 ENCOUNTER — Other Ambulatory Visit: Payer: Self-pay | Admitting: Family Medicine

## 2015-12-02 IMAGING — CR DG CERVICAL SPINE COMPLETE 4+V
6 series · 6 of 6 positions shown · non-contrast
Comparison: None.

CLINICAL DATA: presents With pain in his neck and left shoulder. He
walked out to the mailbox after work to get his mail and after he
walked back up on a porch he standing on the porchand a tree branch
fell and struck him in the in the head and glanced off his head and
struck him in the left shoulder. He is not really complaining much
of headache or neck pain. He is bitterly complaining of pain in his
left shoulder. He denies any radiation of pain numbness tingling or
weakness. And he had no loss of consciousness has no neurologic or
visual symptoms.

EXAM:
CERVICAL SPINE  4+ VIEWS

[lateral]
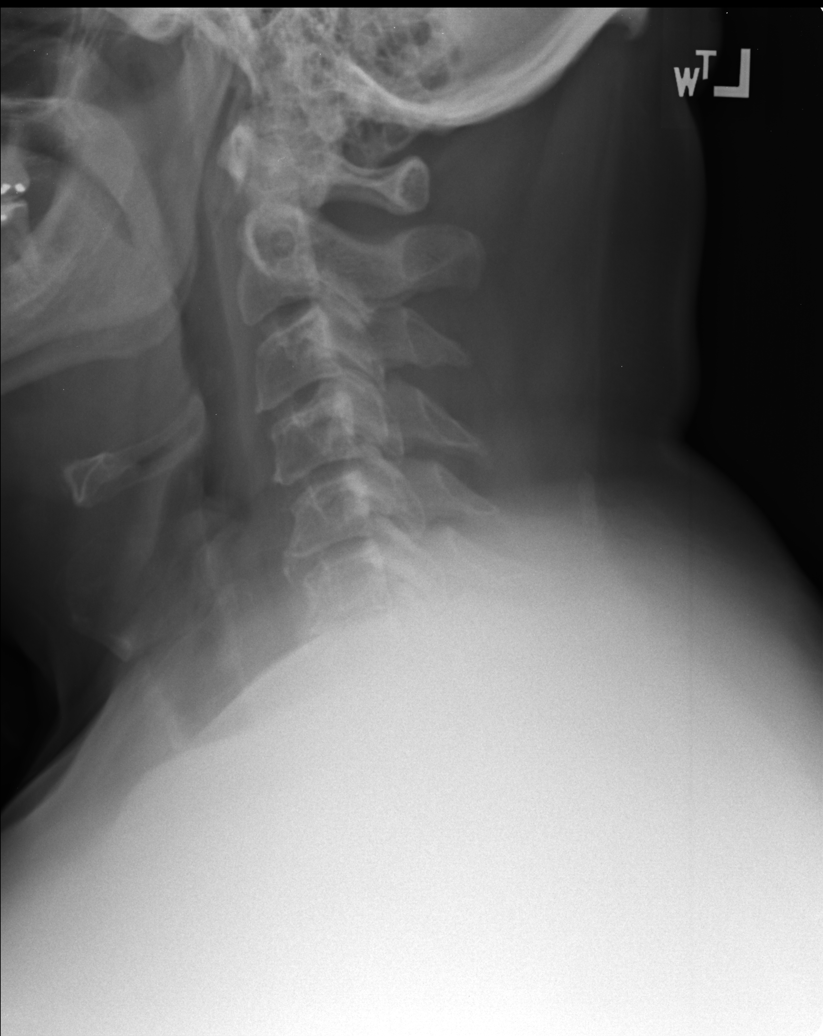

[AP]
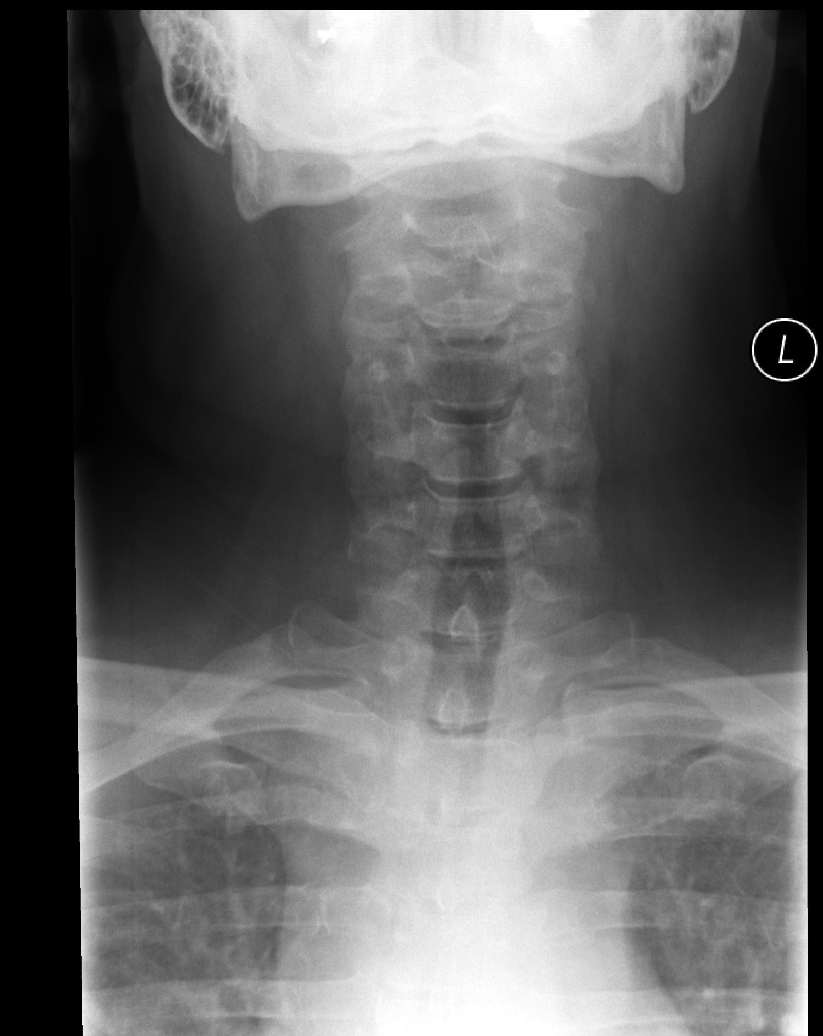

[ap open mouth]
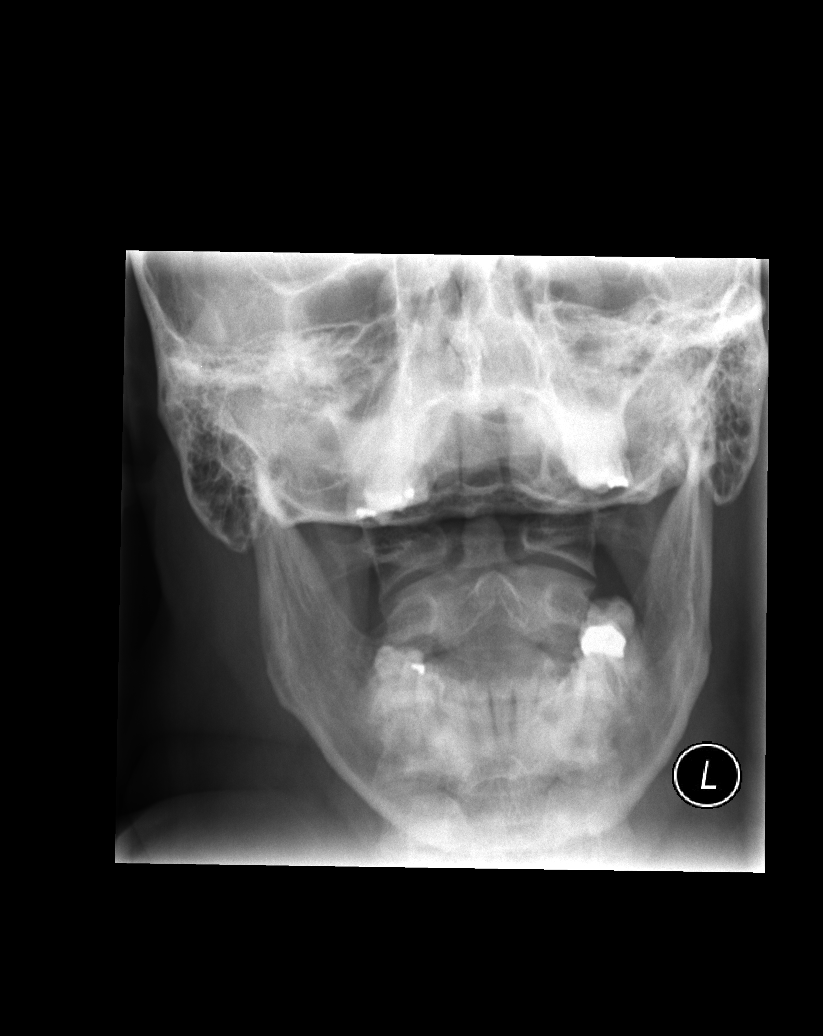

[swimmers]
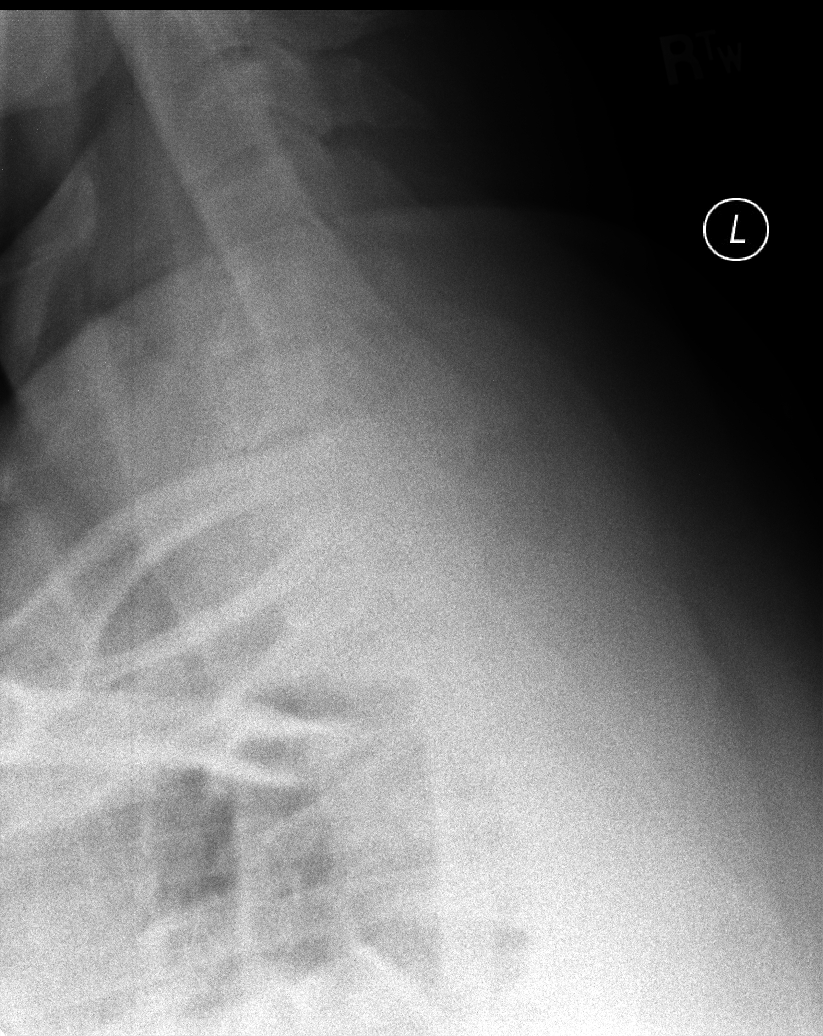

[rao]
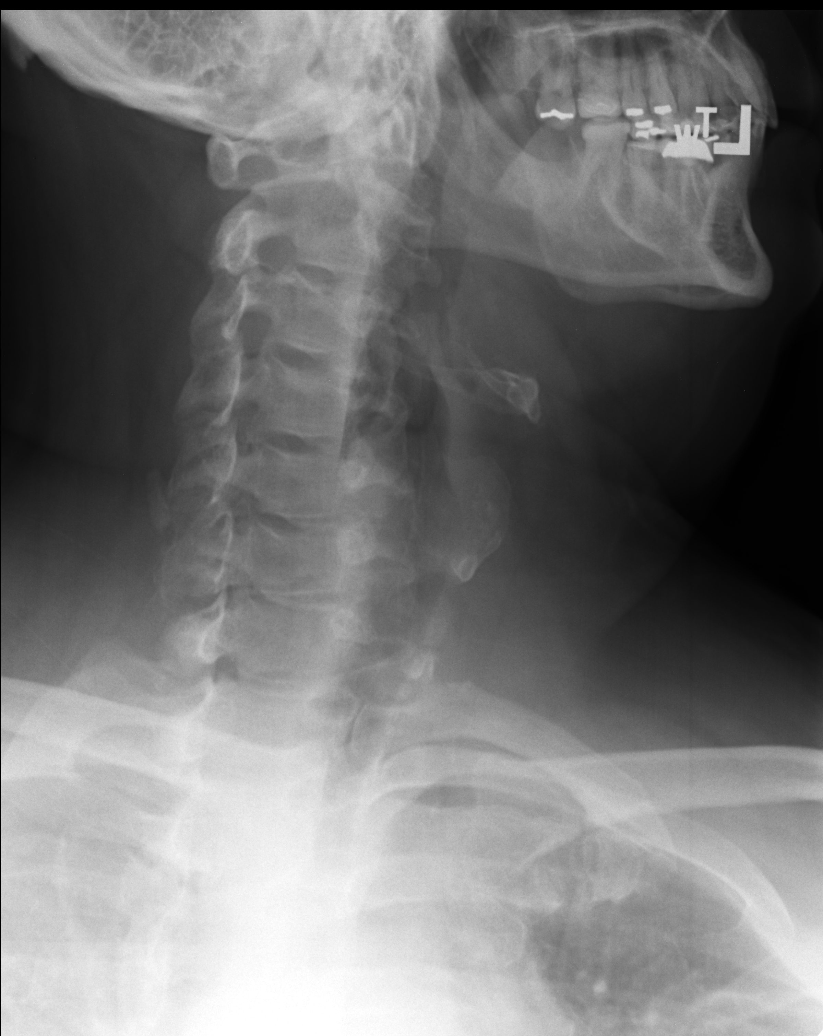

[lao]
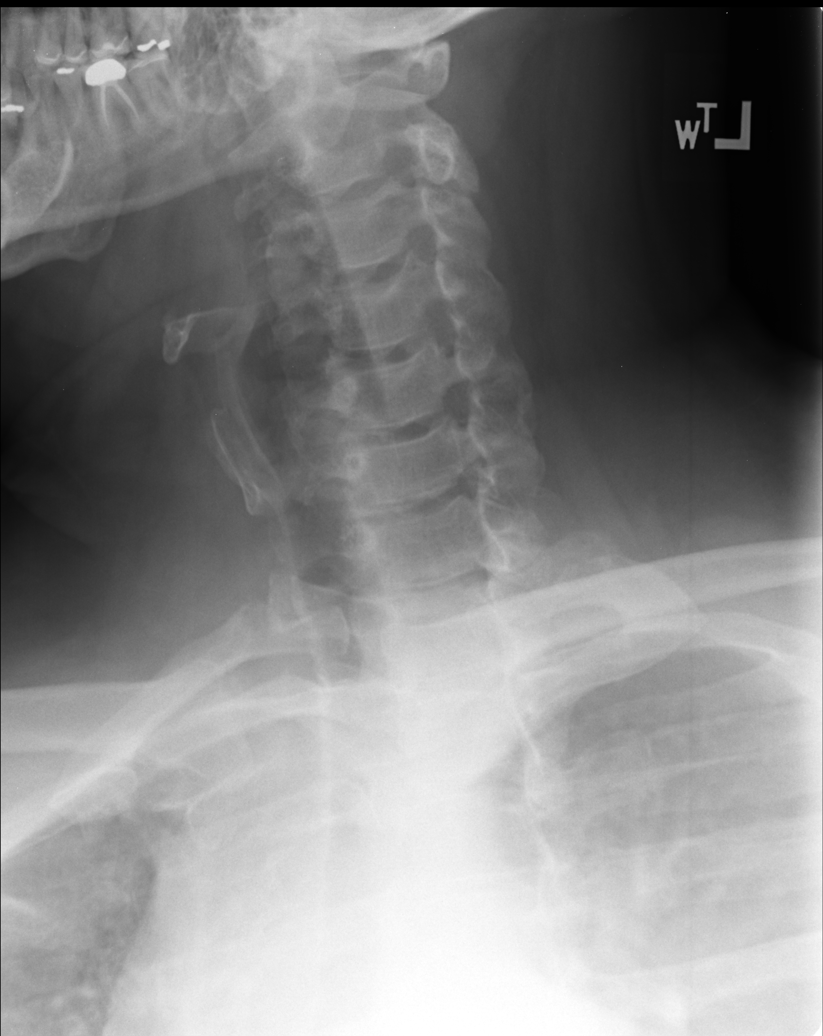

[6 of 6 positions shown; findings below may reference images not displayed]

FINDINGS: No fracture or spondylolisthesis.

Small endplate osteophytes from C3 through C7. Mild loss disc height
most evident at C5-C6 and C6-C7.

Soft tissues are unremarkable.
IMPRESSION: No fracture or acute finding.  Mild disc degenerative change.

## 2015-12-02 IMAGING — CR DG SHOULDER 2+V*L*
3 series · 3 of 3 positions shown · non-contrast
Comparison: No priors.

CLINICAL DATA: 58-year-old male with history of trauma to the left
shoulder complaining of left shoulder pain.

EXAM:
LEFT SHOULDER - 2+ VIEW

[AP (1 of 2)]
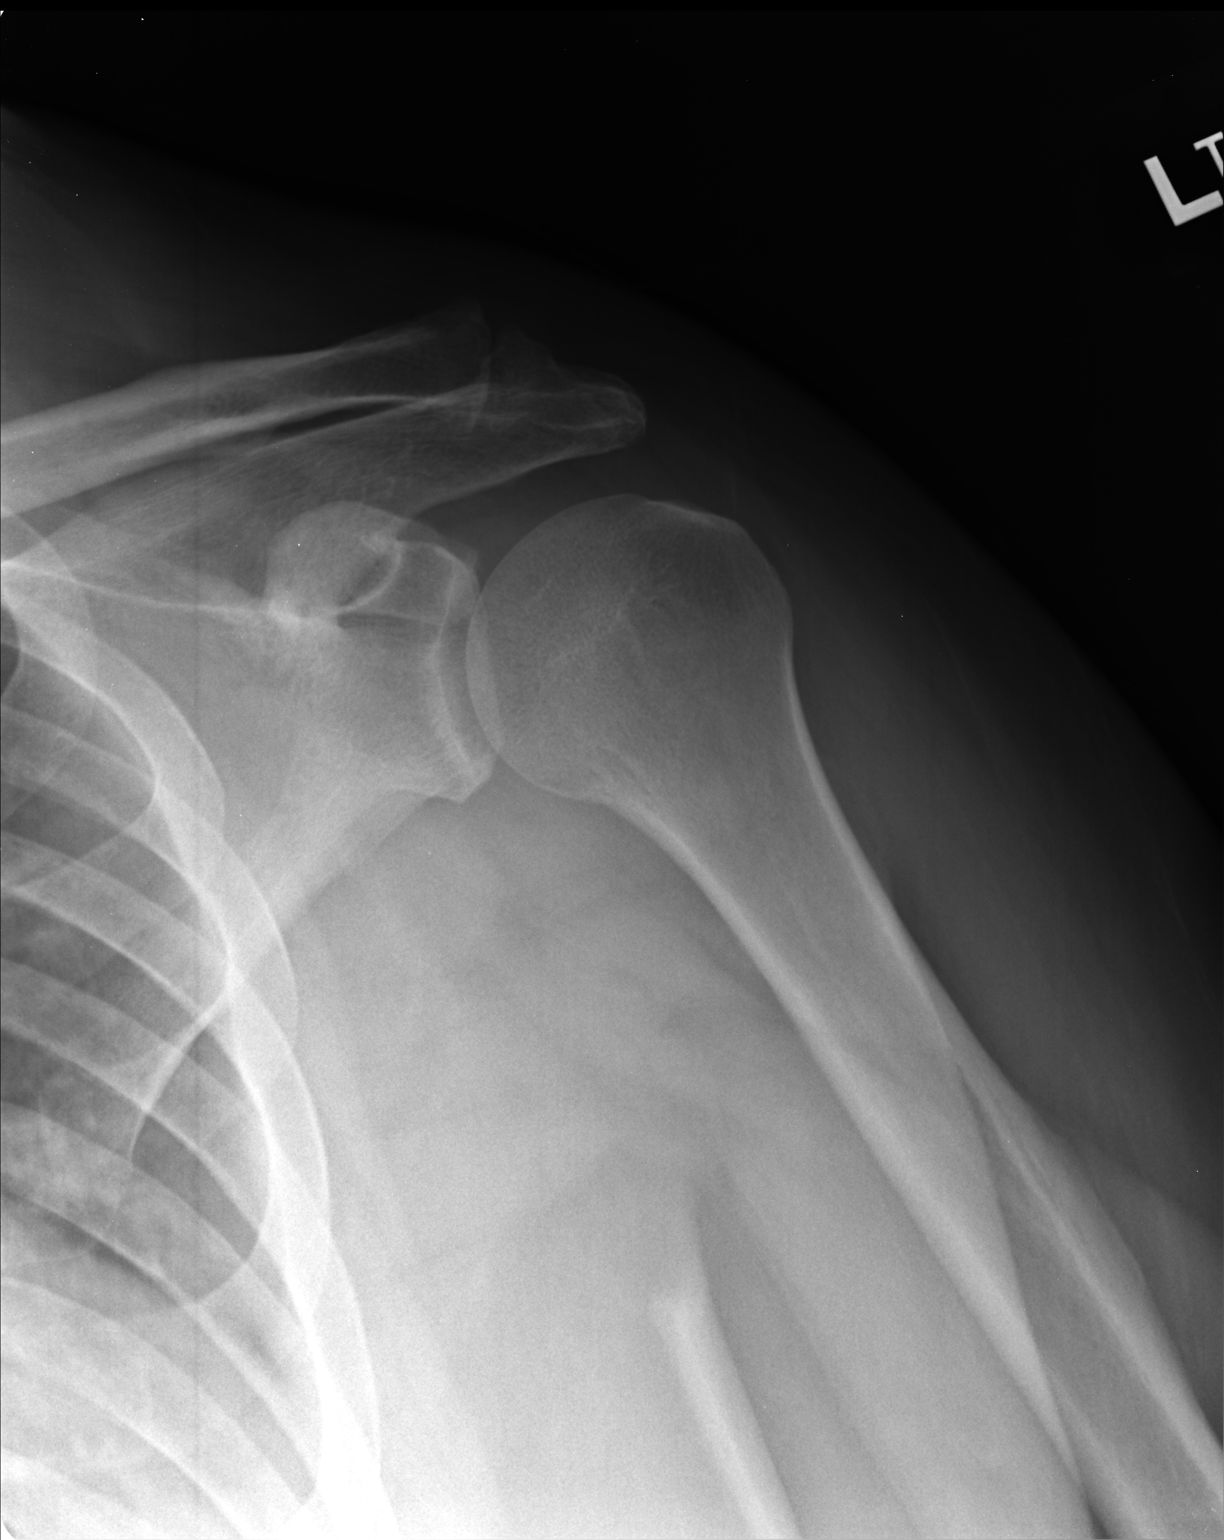

[AP (2 of 2)]
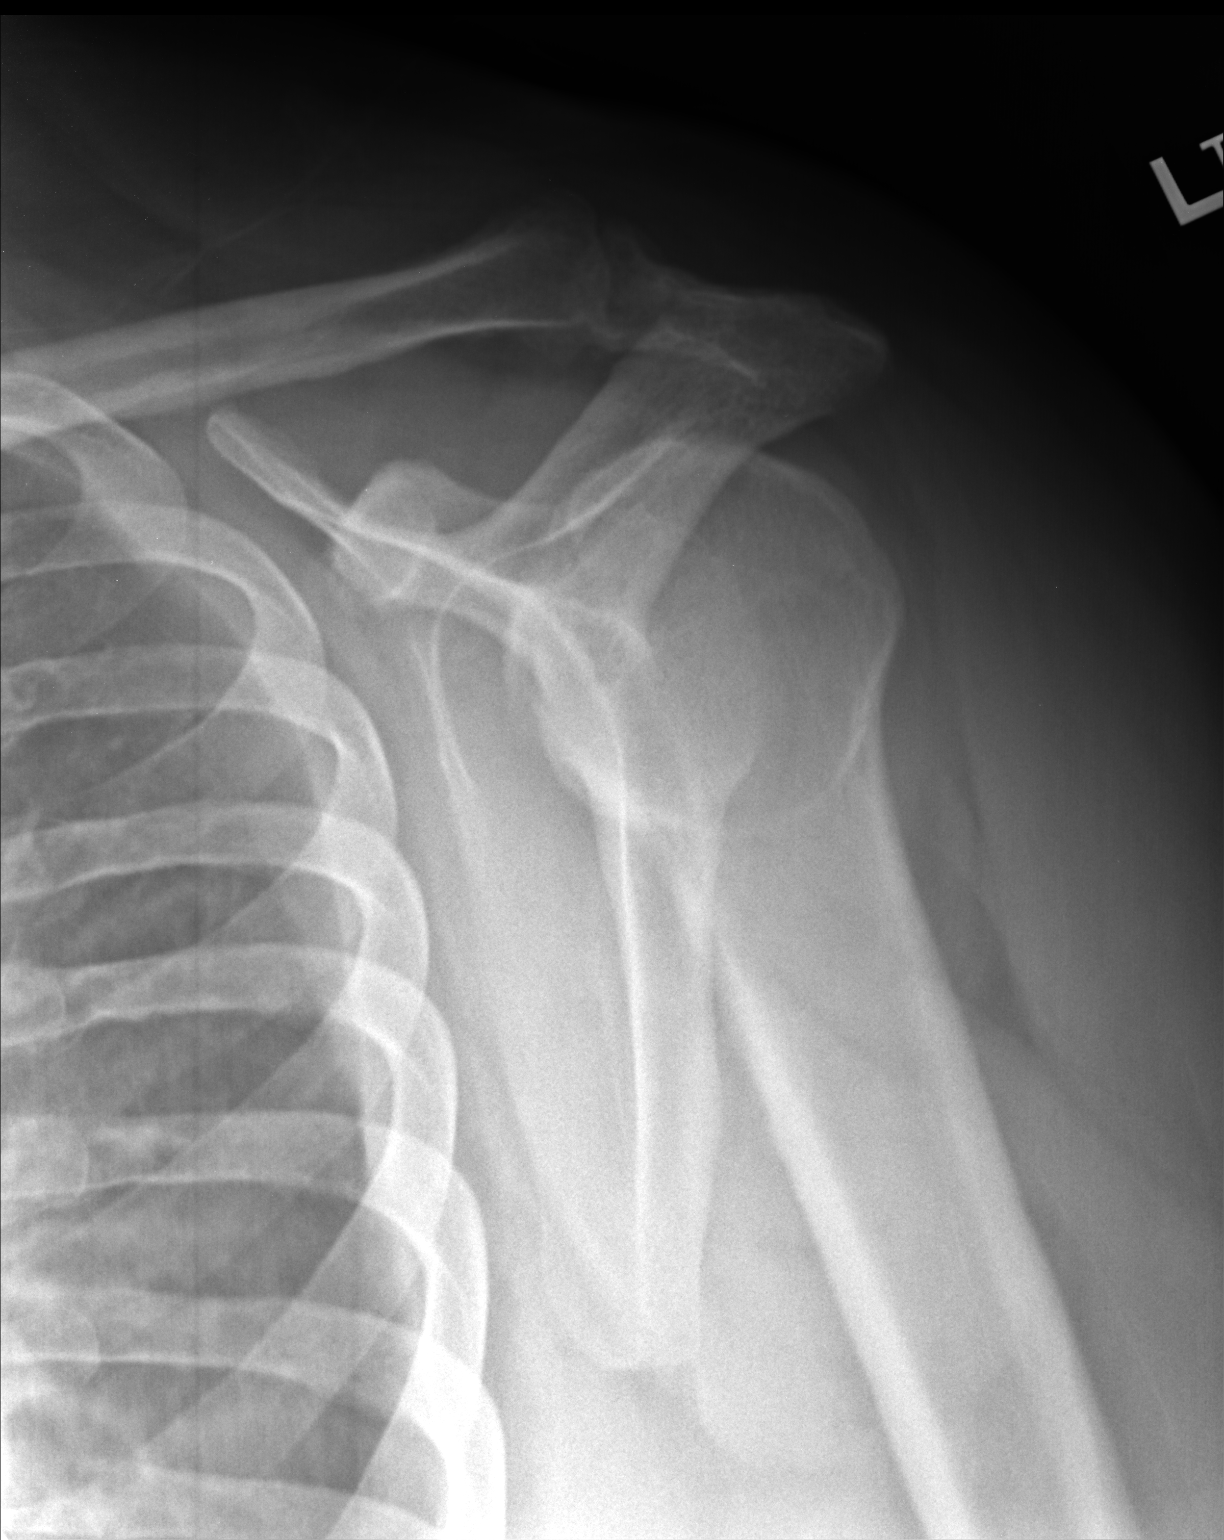

[axial]
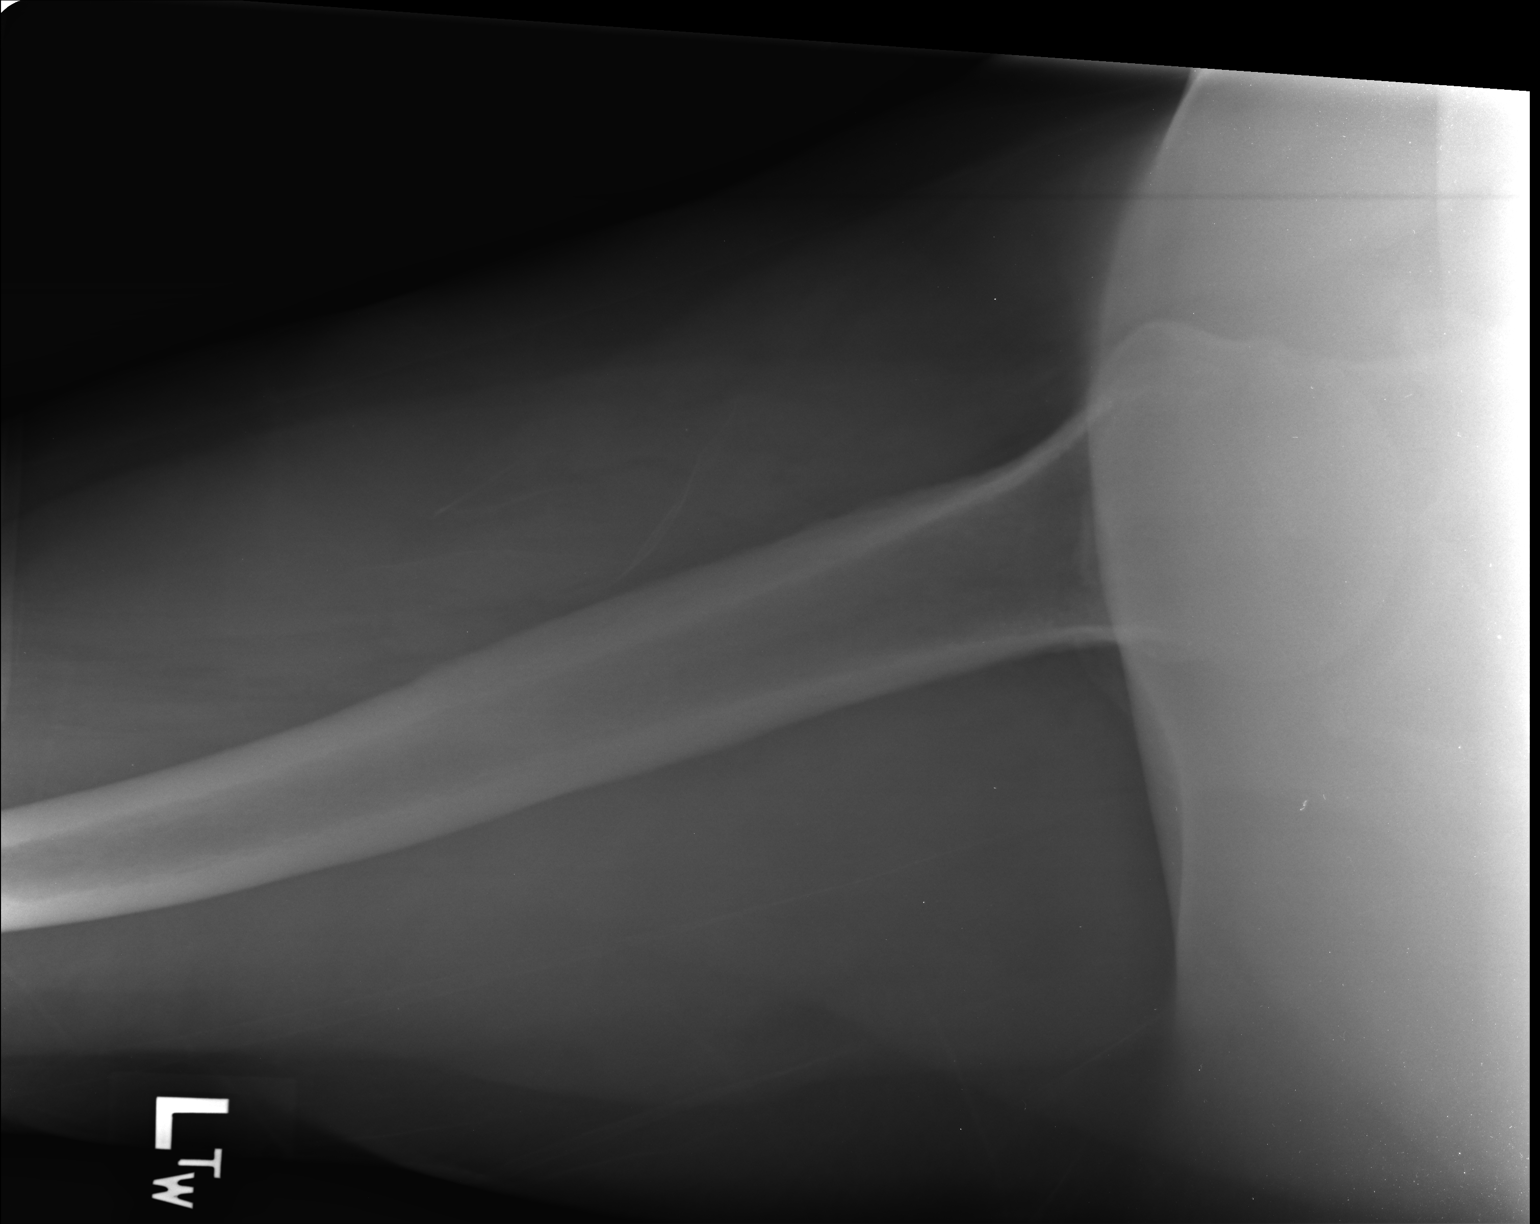

[3 of 3 positions shown; findings below may reference images not displayed]

FINDINGS: Multiple views of the left shoulder demonstrate no acute displaced
fracture, subluxation, dislocation, or soft tissue abnormality.
IMPRESSION: No acute radiographic abnormality of the left shoulder.
